# Patient Record
Sex: Female | Born: 1960 | Race: White | Hispanic: No | Marital: Married | Smoking: Never smoker
Health system: Southern US, Community
[De-identification: ages and names within clinical notes are randomized; demographics above are authoritative.]

## PROBLEM LIST (undated history)

## (undated) DIAGNOSIS — D0512 Intraductal carcinoma in situ of left breast: Secondary | ICD-10-CM

## (undated) DIAGNOSIS — J45909 Unspecified asthma, uncomplicated: Secondary | ICD-10-CM

## (undated) HISTORY — PX: WISDOM TOOTH EXTRACTION: SHX21

---

## 1997-06-23 ENCOUNTER — Other Ambulatory Visit: Admission: RE | Admit: 1997-06-23 | Discharge: 1997-06-23 | Payer: Self-pay | Admitting: Obstetrics & Gynecology

## 1998-09-20 ENCOUNTER — Other Ambulatory Visit: Admission: RE | Admit: 1998-09-20 | Discharge: 1998-09-20 | Payer: Self-pay | Admitting: Obstetrics & Gynecology

## 2000-03-29 ENCOUNTER — Other Ambulatory Visit: Admission: RE | Admit: 2000-03-29 | Discharge: 2000-03-29 | Payer: Self-pay | Admitting: Obstetrics & Gynecology

## 2001-06-10 ENCOUNTER — Other Ambulatory Visit: Admission: RE | Admit: 2001-06-10 | Discharge: 2001-06-10 | Payer: Self-pay | Admitting: Obstetrics & Gynecology

## 2002-07-09 ENCOUNTER — Other Ambulatory Visit: Admission: RE | Admit: 2002-07-09 | Discharge: 2002-07-09 | Payer: Self-pay | Admitting: Obstetrics & Gynecology

## 2004-03-02 ENCOUNTER — Other Ambulatory Visit: Admission: RE | Admit: 2004-03-02 | Discharge: 2004-03-02 | Payer: Self-pay | Admitting: Obstetrics & Gynecology

## 2014-02-05 ENCOUNTER — Other Ambulatory Visit: Payer: Self-pay | Admitting: Obstetrics & Gynecology

## 2014-02-05 DIAGNOSIS — R928 Other abnormal and inconclusive findings on diagnostic imaging of breast: Secondary | ICD-10-CM

## 2014-02-25 ENCOUNTER — Ambulatory Visit
Admission: RE | Admit: 2014-02-25 | Discharge: 2014-02-25 | Disposition: A | Discharge status: Home / Self Care | Payer: BLUE CROSS/BLUE SHIELD | Source: Ambulatory Visit | Attending: Obstetrics & Gynecology | Admitting: Obstetrics & Gynecology

## 2014-02-25 DIAGNOSIS — R928 Other abnormal and inconclusive findings on diagnostic imaging of breast: Secondary | ICD-10-CM

## 2019-10-08 LAB — COLOGUARD

## 2020-01-08 LAB — EXTERNAL GENERIC LAB PROCEDURE: COLOGUARD: NEGATIVE

## 2020-01-08 LAB — COLOGUARD: COLOGUARD: NEGATIVE

## 2021-02-11 ENCOUNTER — Other Ambulatory Visit: Payer: Self-pay | Admitting: Obstetrics & Gynecology

## 2021-02-11 DIAGNOSIS — R928 Other abnormal and inconclusive findings on diagnostic imaging of breast: Secondary | ICD-10-CM

## 2021-02-25 ENCOUNTER — Other Ambulatory Visit: Payer: Self-pay | Admitting: Obstetrics & Gynecology

## 2021-02-25 ENCOUNTER — Ambulatory Visit
Admission: RE | Admit: 2021-02-25 | Discharge: 2021-02-25 | Disposition: A | Discharge status: Home / Self Care | Payer: 59 | Source: Ambulatory Visit | Attending: Obstetrics & Gynecology | Admitting: Obstetrics & Gynecology

## 2021-02-25 ENCOUNTER — Ambulatory Visit
Admission: RE | Admit: 2021-02-25 | Discharge: 2021-02-25 | Disposition: A | Discharge status: Home / Self Care | Payer: BLUE CROSS/BLUE SHIELD | Source: Ambulatory Visit | Attending: Obstetrics & Gynecology | Admitting: Obstetrics & Gynecology

## 2021-02-25 DIAGNOSIS — R928 Other abnormal and inconclusive findings on diagnostic imaging of breast: Secondary | ICD-10-CM

## 2021-02-25 DIAGNOSIS — N6489 Other specified disorders of breast: Secondary | ICD-10-CM

## 2021-03-23 ENCOUNTER — Other Ambulatory Visit: Payer: BLUE CROSS/BLUE SHIELD

## 2021-04-08 ENCOUNTER — Ambulatory Visit: Payer: Self-pay | Admitting: Surgery

## 2021-04-08 DIAGNOSIS — C50912 Malignant neoplasm of unspecified site of left female breast: Secondary | ICD-10-CM

## 2021-04-08 NOTE — H&P (Signed)
?Subjective  ?  ?Chief Complaint: Breast Cancer ?  ?  ?  ?History of Present Illness: ?Gabriella Chaney is a 61 y.o. female who is seen today as an office consultation at the request of Dr. Stann Mainland for evaluation of Breast Cancer ?.   ?  ?This is a 61 year old female in good health who presents after recent routine screening mammogram revealed an area of asymmetry in the left upper outer quadrant.  She underwent further work-up including diagnostic mammogram and ultrasound.  There is no ultrasound correlate.  Ultrasound showed that her axilla was negative.  She underwent stereotactic biopsy at imaging center in [place].  She had a 1.2 cm mass that was biopsied under stereotactic guidance.  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  She presents now for her initial surgical evaluation.  She has not yet been referred to oncology or radiation oncology. ?  ?No family history of breast cancer in first-degree relatives.  No previous breast surgery.  No problems prior to her mammogram. ?  ?  ?Review of Systems: ?A complete review of systems was obtained from the patient.  I have reviewed this information and discussed as appropriate with the patient.  See HPI as well for other ROS. ?  ?Review of Systems  ?Constitutional: Negative.   ?HENT: Negative.   ?Eyes: Negative.   ?Respiratory: Negative.   ?Cardiovascular: Negative.   ?Gastrointestinal: Negative.   ?Genitourinary: Negative.   ?Musculoskeletal: Negative.   ?Skin: Negative.   ?Neurological: Negative.   ?Endo/Heme/Allergies: Negative.   ?Psychiatric/Behavioral: Negative.   ?  ?  ?  ?Medical History: ?Past Medical History  ?History reviewed. No pertinent past medical history.  ? ?  ?   ?Patient Active Problem List  ?Diagnosis  ? Invasive ductal carcinoma of breast, left (CMS-HCC)  ?  ?  ?Past Surgical History  ?History reviewed. No pertinent surgical history.  ?  ?  ?Allergies  ?No Known Allergies  ? ?  ?No current outpatient  medications on file prior to visit.  ?  ?No current facility-administered medications on file prior to visit.  ?  ?  ?Family History  ?     ?Family History  ?Problem Relation Age of Onset  ? Skin cancer Father    ? Obesity Father    ? High blood pressure (Hypertension) Father    ? Diabetes Father    ? Coronary Artery Disease (Blocked arteries around heart) Father    ?  ?  ?  ?Social History  ?  ?   ?Tobacco Use  ?Smoking Status Never  ?Smokeless Tobacco Never  ?  ?  ?Social History  ?Social History  ?  ?    ?Socioeconomic History  ? Marital status: Married  ?Tobacco Use  ? Smoking status: Never  ? Smokeless tobacco: Never  ?Vaping Use  ? Vaping Use: Never used  ?Substance and Sexual Activity  ? Alcohol use: Yes  ? Drug use: Never  ?  ?  ?  ?Objective:  ?  ?  ?   ?Vitals:  ?  04/08/21 1111  ?BP: 124/76  ?Pulse: 92  ?Temp: 36.1 ?C (97 ?F)  ?SpO2: 99%  ?Weight: 70.1 kg (154 lb 9.6 oz)  ?Height: 165.1 cm ($RemoveB'5\' 5"'GWmGRUzR$ )  ?  ?Body mass index is 25.73 kg/m?. ?  ?Physical Exam  ?  ?Constitutional:  WDWN in NAD, conversant, no obvious deformities; lying in bed comfortably ?Eyes:  Pupils equal, round; sclera anicteric;  moist conjunctiva; no lid lag ?HENT:  Oral mucosa moist; good dentition  ?Neck:  No masses palpated, trachea midline; no thyromegaly ?Lungs:  CTA bilaterally; normal respiratory effort ?Breasts:  symmetric, no nipple changes; no palpable masses or lymphadenopathy on the lateral right side; the left upper outer quadrant shows some bruising and firmness underneath the skin at the site of her biopsy.  Previous to the biopsy there was no palpable mass in this area per the patient.  No axillary lymphadenopathy is palpated. ?CV:  Regular rate and rhythm; no murmurs; extremities well-perfused with no edema ?Abd:  +bowel sounds, soft, non-tender, no palpable organomegaly; no palpable hernias ?Musc:  Unable to assess gait; no apparent clubbing or cyanosis in extremities ?Lymphatic:  No palpable cervical or axillary  lymphadenopathy ?Skin:  Warm, dry; no sign of jaundice ?Psychiatric - alert and oriented x 4; calm mood and affect ?  ?  ?Labs, Imaging and Diagnostic Testing: ?CLINICAL DATA:  Screening recall for a possible left breast ?asymmetry. ?  ?EXAM: ?DIGITAL DIAGNOSTIC UNILATERAL LEFT MAMMOGRAM WITH TOMOSYNTHESIS AND ?CAD; ULTRASOUND LEFT BREAST LIMITED ?  ?TECHNIQUE: ?Left digital diagnostic mammography and breast tomosynthesis was ?performed. The images were evaluated with computer-aided detection.; ?Targeted ultrasound examination of the left breast was performed. ?  ?COMPARISON:  Previous exam(s). ?  ?ACR Breast Density Category c: The breast tissue is heterogeneously ?dense, which may obscure small masses. ?  ?FINDINGS: ?Possible asymmetry, which was noted in the posterior, upper outer ?left breast, persists on the diagnostic images as a small irregular ?focal asymmetry or mass, measuring 9 mm in greatest dimension. This ?lies posteriorly in the upper outer left breast at approximately 2 ?o'clock. ?  ?Targeted ultrasound is performed, showing normal tissue throughout ?the 2 to 3 o'clock position of the left breast. No mass or ?suspicious lesion. No sonographic correlate to the mammographic ?finding. Left axilla ultrasound demonstrates normal lymph nodes. ?There are no enlarged or abnormal lymph nodes. ?  ?IMPRESSION: ?1. Irregular focal asymmetry in the posterior upper outer left ?breast. This appears new from prior mammograms. Tissue sampling is ?recommended. ?  ?RECOMMENDATION: ?1. Stereotactic core needle biopsy the small focal asymmetry the ?outer left breast. This procedure was scheduled prior to the patient ?being discharged from the breast Center. ?  ?I have discussed the findings and recommendations with the patient. ?If applicable, a reminder letter will be sent to the patient ?regarding the next appointment. ?  ?BI-RADS CATEGORY  4: Suspicious. ?  ?  ?Electronically Signed ?  By: Lajean Manes M.D. ?  On:  02/25/2021 16:38 ?  ?  ?Assessment and Plan:  ?Diagnoses and all orders for this visit: ?  ?Invasive ductal carcinoma of breast, left (CMS-HCC) ?-     Ambulatory Referral to Oncology-Medical ?-     Ambulatory Referral to Radiation Oncology ?  ?  ?  ?After a long discussion with the patient regarding her disease and treatment options, we discussed surgical options including mastectomy versus breast conserving therapy.  She has opted for breast conserving therapy.  We will plan a left radioactive seed localized lumpectomy with sentinel lymph node biopsy. ?  ?The surgical procedure has been discussed with the patient.  Potential risks, benefits, alternative treatments, and expected outcomes have been explained.  All of the patient's questions at this time have been answered.  The likelihood of reaching the patient's treatment goal is good.  The patient understand the proposed surgical procedure and wishes to proceed. ?  ?  ?No follow-ups  on file. ?  ?Blimy Napoleon Jearld Adjutant, MD  ?04/08/2021 ?11:54 AM ?  ?  ?High MDM ?  ?

## 2021-04-08 NOTE — H&P (View-Only) (Signed)
?Subjective  ?  ?Chief Complaint: Breast Cancer ?  ?  ?  ?History of Present Illness: ?Gabriella Chaney is a 61 y.o. female who is seen today as an office consultation at the request of Dr. Stann Mainland for evaluation of Breast Cancer ?.   ?  ?This is a 61 year old female in good health who presents after recent routine screening mammogram revealed an area of asymmetry in the left upper outer quadrant.  She underwent further work-up including diagnostic mammogram and ultrasound.  There is no ultrasound correlate.  Ultrasound showed that her axilla was negative.  She underwent stereotactic biopsy at imaging center in [place].  She had a 1.2 cm mass that was biopsied under stereotactic guidance.  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  She presents now for her initial surgical evaluation.  She has not yet been referred to oncology or radiation oncology. ?  ?No family history of breast cancer in first-degree relatives.  No previous breast surgery.  No problems prior to her mammogram. ?  ?  ?Review of Systems: ?A complete review of systems was obtained from the patient.  I have reviewed this information and discussed as appropriate with the patient.  See HPI as well for other ROS. ?  ?Review of Systems  ?Constitutional: Negative.   ?HENT: Negative.   ?Eyes: Negative.   ?Respiratory: Negative.   ?Cardiovascular: Negative.   ?Gastrointestinal: Negative.   ?Genitourinary: Negative.   ?Musculoskeletal: Negative.   ?Skin: Negative.   ?Neurological: Negative.   ?Endo/Heme/Allergies: Negative.   ?Psychiatric/Behavioral: Negative.   ?  ?  ?  ?Medical History: ?Past Medical History  ?History reviewed. No pertinent past medical history.  ? ?  ?   ?Patient Active Problem List  ?Diagnosis  ? Invasive ductal carcinoma of breast, left (CMS-HCC)  ?  ?  ?Past Surgical History  ?History reviewed. No pertinent surgical history.  ?  ?  ?Allergies  ?No Known Allergies  ? ?  ?No current outpatient  medications on file prior to visit.  ?  ?No current facility-administered medications on file prior to visit.  ?  ?  ?Family History  ?     ?Family History  ?Problem Relation Age of Onset  ? Skin cancer Father    ? Obesity Father    ? High blood pressure (Hypertension) Father    ? Diabetes Father    ? Coronary Artery Disease (Blocked arteries around heart) Father    ?  ?  ?  ?Social History  ?  ?   ?Tobacco Use  ?Smoking Status Never  ?Smokeless Tobacco Never  ?  ?  ?Social History  ?Social History  ?  ?    ?Socioeconomic History  ? Marital status: Married  ?Tobacco Use  ? Smoking status: Never  ? Smokeless tobacco: Never  ?Vaping Use  ? Vaping Use: Never used  ?Substance and Sexual Activity  ? Alcohol use: Yes  ? Drug use: Never  ?  ?  ?  ?Objective:  ?  ?  ?   ?Vitals:  ?  04/08/21 1111  ?BP: 124/76  ?Pulse: 92  ?Temp: 36.1 ?C (97 ?F)  ?SpO2: 99%  ?Weight: 70.1 kg (154 lb 9.6 oz)  ?Height: 165.1 cm ($RemoveB'5\' 5"'RkJoVgMC$ )  ?  ?Body mass index is 25.73 kg/m?. ?  ?Physical Exam  ?  ?Constitutional:  WDWN in NAD, conversant, no obvious deformities; lying in bed comfortably ?Eyes:  Pupils equal, round; sclera anicteric;  moist conjunctiva; no lid lag ?HENT:  Oral mucosa moist; good dentition  ?Neck:  No masses palpated, trachea midline; no thyromegaly ?Lungs:  CTA bilaterally; normal respiratory effort ?Breasts:  symmetric, no nipple changes; no palpable masses or lymphadenopathy on the lateral right side; the left upper outer quadrant shows some bruising and firmness underneath the skin at the site of her biopsy.  Previous to the biopsy there was no palpable mass in this area per the patient.  No axillary lymphadenopathy is palpated. ?CV:  Regular rate and rhythm; no murmurs; extremities well-perfused with no edema ?Abd:  +bowel sounds, soft, non-tender, no palpable organomegaly; no palpable hernias ?Musc:  Unable to assess gait; no apparent clubbing or cyanosis in extremities ?Lymphatic:  No palpable cervical or axillary  lymphadenopathy ?Skin:  Warm, dry; no sign of jaundice ?Psychiatric - alert and oriented x 4; calm mood and affect ?  ?  ?Labs, Imaging and Diagnostic Testing: ?CLINICAL DATA:  Screening recall for a possible left breast ?asymmetry. ?  ?EXAM: ?DIGITAL DIAGNOSTIC UNILATERAL LEFT MAMMOGRAM WITH TOMOSYNTHESIS AND ?CAD; ULTRASOUND LEFT BREAST LIMITED ?  ?TECHNIQUE: ?Left digital diagnostic mammography and breast tomosynthesis was ?performed. The images were evaluated with computer-aided detection.; ?Targeted ultrasound examination of the left breast was performed. ?  ?COMPARISON:  Previous exam(s). ?  ?ACR Breast Density Category c: The breast tissue is heterogeneously ?dense, which may obscure small masses. ?  ?FINDINGS: ?Possible asymmetry, which was noted in the posterior, upper outer ?left breast, persists on the diagnostic images as a small irregular ?focal asymmetry or mass, measuring 9 mm in greatest dimension. This ?lies posteriorly in the upper outer left breast at approximately 2 ?o'clock. ?  ?Targeted ultrasound is performed, showing normal tissue throughout ?the 2 to 3 o'clock position of the left breast. No mass or ?suspicious lesion. No sonographic correlate to the mammographic ?finding. Left axilla ultrasound demonstrates normal lymph nodes. ?There are no enlarged or abnormal lymph nodes. ?  ?IMPRESSION: ?1. Irregular focal asymmetry in the posterior upper outer left ?breast. This appears new from prior mammograms. Tissue sampling is ?recommended. ?  ?RECOMMENDATION: ?1. Stereotactic core needle biopsy the small focal asymmetry the ?outer left breast. This procedure was scheduled prior to the patient ?being discharged from the breast Center. ?  ?I have discussed the findings and recommendations with the patient. ?If applicable, a reminder letter will be sent to the patient ?regarding the next appointment. ?  ?BI-RADS CATEGORY  4: Suspicious. ?  ?  ?Electronically Signed ?  By: Lajean Manes M.D. ?  On:  02/25/2021 16:38 ?  ?  ?Assessment and Plan:  ?Diagnoses and all orders for this visit: ?  ?Invasive ductal carcinoma of breast, left (CMS-HCC) ?-     Ambulatory Referral to Oncology-Medical ?-     Ambulatory Referral to Radiation Oncology ?  ?  ?  ?After a long discussion with the patient regarding her disease and treatment options, we discussed surgical options including mastectomy versus breast conserving therapy.  She has opted for breast conserving therapy.  We will plan a left radioactive seed localized lumpectomy with sentinel lymph node biopsy. ?  ?The surgical procedure has been discussed with the patient.  Potential risks, benefits, alternative treatments, and expected outcomes have been explained.  All of the patient's questions at this time have been answered.  The likelihood of reaching the patient's treatment goal is good.  The patient understand the proposed surgical procedure and wishes to proceed. ?  ?  ?No follow-ups  on file. ?  ?Collyn Selk Jearld Adjutant, MD  ?04/08/2021 ?11:54 AM ?  ?  ?High MDM ?  ?

## 2021-04-13 ENCOUNTER — Telehealth: Payer: Self-pay | Admitting: Hematology and Oncology

## 2021-04-13 ENCOUNTER — Encounter (HOSPITAL_COMMUNITY): Payer: Self-pay

## 2021-04-13 ENCOUNTER — Other Ambulatory Visit: Payer: Self-pay | Admitting: Surgery

## 2021-04-13 ENCOUNTER — Telehealth: Payer: Self-pay | Admitting: Genetic Counselor

## 2021-04-13 ENCOUNTER — Other Ambulatory Visit: Payer: Self-pay | Admitting: *Deleted

## 2021-04-13 DIAGNOSIS — C50919 Malignant neoplasm of unspecified site of unspecified female breast: Secondary | ICD-10-CM | POA: Insufficient documentation

## 2021-04-13 DIAGNOSIS — C50412 Malignant neoplasm of upper-outer quadrant of left female breast: Secondary | ICD-10-CM | POA: Insufficient documentation

## 2021-04-13 DIAGNOSIS — C50912 Malignant neoplasm of unspecified site of left female breast: Secondary | ICD-10-CM

## 2021-04-13 NOTE — Telephone Encounter (Signed)
Scheduled appt per 3/8 referral. Pt is aware of appt date and time. Pt is aware to arrive 15 mins prior to appt time and to bring and updated insurance card. Pt is aware of appt location.   ?

## 2021-04-13 NOTE — Pre-Procedure Instructions (Signed)
Surgical Instructions ? ? ? Your procedure is scheduled on Wednesday, March 15th. ? Report to [facility] Main Entrance "A" at 06:30 A.M., then check in with the Admitting office. ? Call this number if you have problems the morning of surgery: ? [phone] ? ? If you have any questions prior to your surgery date call [phone]: Open Monday-Friday 8am-4pm ? ? ? Remember: ? Do not eat after midnight the night before your surgery ? ?You may drink clear liquids until 05:30 AM the morning of your surgery.   ?Clear liquids allowed are: Water, Non-Citrus Juices (without pulp), Carbonated Beverages, Clear Tea, Black Coffee Only (NO MILK, CREAM OR POWDERED CREAMER of any kind), and Gatorade. ? ? ?Patient Instructions ? ?The night before surgery:  ?No food after midnight. ONLY clear liquids after midnight ? ?The day of surgery (if you do NOT have diabetes):  ?Drink ONE (1) Pre-Surgery Clear Ensure by 05:30 AM the morning of surgery. Drink in one sitting. Do not sip.  ?This drink was given to you during your hospital  ?pre-op appointment visit. ? ?Nothing else to drink after completing the  ?Pre-Surgery Clear Ensure. ? ? ?       If you have questions, please contact your surgeon?s office.  ? ?  ? Take these medicines the morning of surgery with A SIP OF WATER: none ? ? ?As of today, STOP taking any Aspirin (unless otherwise instructed by your surgeon) Aleve, Naproxen, Ibuprofen, Motrin, Advil, Goody's, BC's, all herbal medications, fish oil, and all vitamins. ?         ?           ?Do NOT Smoke (Tobacco/Vaping) for 24 hours prior to your procedure. ? ?If you use a CPAP at night, you may bring your mask/headgear for your overnight stay. ?  ?Contacts, glasses, piercing's, hearing aid's, dentures or partials may not be worn into surgery, please bring cases for these belongings.  ?  ?For patients admitted to the hospital, discharge time will be determined by your treatment team. ?  ?Patients discharged the day of surgery will  not be allowed to drive home, and someone needs to stay with them for 24 hours. ? ?NO VISITORS WILL BE ALLOWED IN PRE-OP WHERE PATIENTS ARE PREPPED FOR SURGERY.  ONLY 1 SUPPORT PERSON MAY BE PRESENT IN THE WAITING ROOM WHILE YOU ARE IN SURGERY.  IF YOU ARE TO BE ADMITTED, ONCE YOU ARE IN YOUR ROOM YOU WILL BE ALLOWED TWO (2) VISITORS. (1) VISITOR MAY STAY OVERNIGHT BUT MUST ARRIVE TO THE ROOM BY 8pm.  Minor children may have two parents present. Special consideration for safety and communication needs will be reviewed on a case by case basis. ? ? ?Special instructions:   ?[facility]- Preparing For Surgery ? ?Before surgery, you can play an important role. Because skin is not sterile, your skin needs to be as free of germs as possible. You can reduce the number of germs on your skin by washing with CHG (chlorahexidine gluconate) Soap before surgery.  CHG is an antiseptic cleaner which kills germs and bonds with the skin to continue killing germs even after washing.   ? ?Oral Hygiene is also important to reduce your risk of infection.  Remember - BRUSH YOUR TEETH THE MORNING OF SURGERY WITH YOUR REGULAR TOOTHPASTE ? ?Please do not use if you have an allergy to CHG or antibacterial soaps. If your skin becomes reddened/irritated stop using the CHG.  ?Do not shave (including legs and  underarms) for at least 48 hours prior to first CHG shower. It is OK to shave your face. ? ?Please follow these instructions carefully. ?  ?Shower the NIGHT BEFORE SURGERY and the MORNING OF SURGERY ? ?If you chose to wash your hair, wash your hair first as usual with your normal shampoo. ? ?After you shampoo, rinse your hair and body thoroughly to remove the shampoo. ? ?Use CHG Soap as you would any other liquid soap. You can apply CHG directly to the skin and wash gently with a scrungie or a clean washcloth.  ? ?Apply the CHG Soap to your body ONLY FROM THE NECK DOWN.  Do not use on open wounds or open sores. Avoid contact with your eyes,  ears, mouth and genitals (private parts). Wash Face and genitals (private parts)  with your normal soap.  ? ?Wash thoroughly, paying special attention to the area where your surgery will be performed. ? ?Thoroughly rinse your body with warm water from the neck down. ? ?DO NOT shower/wash with your normal soap after using and rinsing off the CHG Soap. ? ?Pat yourself dry with a CLEAN TOWEL. ? ?Wear CLEAN PAJAMAS to bed the night before surgery ? ?Place CLEAN SHEETS on your bed the night before your surgery ? ?DO NOT SLEEP WITH PETS. ? ? ?Day of Surgery: ?Take a shower with CHG soap. ?Do not wear jewelry or makeup ?Do not wear lotions, powders, perfumes, or deodorant. ?Do not shave 48 hours prior to surgery.   ?Do not bring valuables to the hospital. [facility] is not responsible for any belongings or valuables. ?Do not wear nail polish, gel polish, artificial nails, or any other type of covering on natural nails (fingers and toes) ?If you have artificial nails or gel coating that need to be removed by a nail salon, please have this removed prior to surgery. Artificial nails or gel coating may interfere with anesthesia's ability to adequately monitor your vital signs. ? ?Wear Clean/Comfortable clothing the morning of surgery ?Remember to brush your teeth WITH YOUR REGULAR TOOTHPASTE. ?  ?Please read over the following fact sheets that you were given. ? ? ?3 days prior to your procedure  ? ?? You are not required to quarantine however you are required to wear a well-fitting mask when you are out and around people not in your household. If your mask becomes wet or soiled, replace with a new one. ? ?? Wash your hands often with soap and water for 20 seconds or clean your hands with an alcohol-based hand sanitizer that contains at least 60% alcohol. ? ?? Do not share personal items. ? ?? Notify your provider: ? ?o if you are in close contact with someone who has COVID ? ?o or if you develop a fever of 100.4 or greater,  sneezing, cough, sore throat, shortness of breath or body aches.   ?

## 2021-04-13 NOTE — Progress Notes (Signed)
PCP - Dr Vania Rea ?Cardiologist - n/a ? ?Chest x-ray - n/a ?EKG - n/a ?Stress Test - n/a ?ECHO - n/a ?Cardiac Cath - n/a ? ?ICD Pacemaker/Loop - n/a ? ?Sleep Study -  n/a ?CPAP - none ? ?ERAS: Clear liquids til 5:30 AM DOS. ? ?STOP now taking any Aspirin (unless otherwise instructed by your surgeon), Aleve, Naproxen, Ibuprofen, Motrin, Advil, Goody's, BC's, all herbal medications, fish oil, and all vitamins.  ? ?Coronavirus Screening ?Covid test n/a Ambulatory Surgery  ?Do you have any of the following symptoms:  ?Cough yes/no: No ?Fever (>100.66F)  yes/no: No ?Runny nose yes/no: No ?Sore throat yes/no: No ?Difficulty breathing/shortness of breath  yes/no: No ? ?Have you traveled in the last 14 days and where? yes/no: No ? ?Patient verbalized understanding of instructions that were given to them at the PAT appointment. Patient was also instructed that they will need to review over the PAT instructions again at home before surgery. ? ?

## 2021-04-14 ENCOUNTER — Other Ambulatory Visit: Payer: Self-pay

## 2021-04-14 ENCOUNTER — Encounter (HOSPITAL_COMMUNITY): Payer: Self-pay

## 2021-04-14 ENCOUNTER — Ambulatory Visit: Payer: PRIVATE HEALTH INSURANCE | Attending: Surgery

## 2021-04-14 ENCOUNTER — Encounter (HOSPITAL_COMMUNITY)
Admission: RE | Admit: 2021-04-14 | Discharge: 2021-04-14 | Disposition: A | Discharge status: Home / Self Care | Payer: PRIVATE HEALTH INSURANCE | Source: Ambulatory Visit | Attending: Surgery | Admitting: Surgery

## 2021-04-14 ENCOUNTER — Encounter: Payer: Self-pay | Admitting: *Deleted

## 2021-04-14 VITALS — BP 111/71 | HR 67 | Temp 98.1°F | Resp 17 | Ht 65.0 in | Wt 154.4 lb

## 2021-04-14 DIAGNOSIS — Z170 Estrogen receptor positive status [ER+]: Secondary | ICD-10-CM | POA: Insufficient documentation

## 2021-04-14 DIAGNOSIS — Z01812 Encounter for preprocedural laboratory examination: Secondary | ICD-10-CM | POA: Insufficient documentation

## 2021-04-14 DIAGNOSIS — C50912 Malignant neoplasm of unspecified site of left female breast: Secondary | ICD-10-CM | POA: Insufficient documentation

## 2021-04-14 DIAGNOSIS — R293 Abnormal posture: Secondary | ICD-10-CM | POA: Diagnosis present

## 2021-04-14 DIAGNOSIS — Z01818 Encounter for other preprocedural examination: Secondary | ICD-10-CM

## 2021-04-14 HISTORY — DX: Intraductal carcinoma in situ of left breast: D05.12

## 2021-04-14 HISTORY — DX: Unspecified asthma, uncomplicated: J45.909

## 2021-04-14 LAB — CBC
HCT: 41.5 % (ref 36.0–46.0)
Hemoglobin: 13.3 g/dL (ref 12.0–15.0)
MCH: 30.2 pg (ref 26.0–34.0)
MCHC: 32 g/dL (ref 30.0–36.0)
MCV: 94.3 fL (ref 80.0–100.0)
Platelets: 252 10*3/uL (ref 150–400)
RBC: 4.4 MIL/uL (ref 3.87–5.11)
RDW: 12.9 % (ref 11.5–15.5)
WBC: 4.6 10*3/uL (ref 4.0–10.5)
nRBC: 0 % (ref 0.0–0.2)

## 2021-04-14 NOTE — Therapy (Signed)
OUTPATIENT PHYSICAL THERAPY BREAST CANCER BASELINE EVALUATION   Patient Name: Gabriella Chaney MRN: [id number] DOB:05-23-1960, 61 y.o., female Today's Date: 04/14/2021   PT End of Session - 04/14/21 1502     Visit Number 1    Number of Visits 2    Date for PT Re-Evaluation 05/26/21    PT Start Time 1303    PT Stop Time 1349    PT Time Calculation (min) 46 min    Activity Tolerance Patient tolerated treatment well    Behavior During Therapy WFL for tasks assessed/performed             Past Medical History:  Diagnosis Date   Asthma    as a child, no current problems as an adult, no inhaler   Ductal carcinoma in situ (DCIS) of left breast    Past Surgical History:  Procedure Laterality Date   WISDOM TOOTH EXTRACTION     Patient Active Problem List   Diagnosis Date Noted   Breast cancer in female ([place]) 04/13/2021    PCP: Pcp, No  REFERRING PROVIDER: Donnie Mesa, MD  REFERRING DIAG: Left Breast Cancer  THERAPY DIAG:  Malignant neoplasm of left breast in female, estrogen receptor positive, unspecified site of breast ([place])  Abnormal posture  ONSET DATE: 02/25/2021  SUBJECTIVE                                                                                                                                                                                           SUBJECTIVE STATEMENT: Patient reports she is here today to be seen by her medical team for her newly diagnosed left breast cancer.   PERTINENT HISTORY:  Patient was diagnosed on 03/23/2021 with left grade 2 Invasive Ductal Carcinoma. It measures 1.2 cm and is located in the upper outer quadrant. It is ER+, PR+,HER 2-    PATIENT GOALS   reduce lymphedema risk and learn post op HEP.   PAIN:  Are you having pain? No   PRECAUTIONS: Active CA  HAND DOMINANCE: left  WEIGHT BEARING RESTRICTIONS No  FALLS:  Has patient fallen in last 6 months? No, Number of falls: 0  LIVING ENVIRONMENT: Patient lives with:  husband Lives in: House/apartment  OCCUPATION: [employer] loans works in Technical sales engineer, Full time  LEISURE: none  PRIOR LEVEL OF FUNCTION: Independent   OBJECTIVE  COGNITION:  Overall cognitive status: Within functional limits for tasks assessed    POSTURE:  Forward head and rounded shoulders posture  UPPER EXTREMITY AROM/PROM:  A/PROM RIGHT  04/14/2021   Shoulder extension 60  Shoulder flexion 158  Shoulder abduction 180  Shoulder internal rotation 68  Shoulder external rotation 105    (Blank rows = not tested)  A/PROM LEFT  04/14/2021  Shoulder extension 65  Shoulder flexion 159  Shoulder abduction 180  Shoulder internal rotation 65  Shoulder external rotation 107    (Blank rows = not tested)   CERVICAL AROM: All within normal limits:      UPPER EXTREMITY STRENGTH: WNL   LYMPHEDEMA ASSESSMENTS:   LANDMARK RIGHT  04/14/2021  10 cm proximal to olecranon process 28.2  Olecranon process 24.7  10 cm proximal to ulnar styloid process 20.5  Just proximal to ulnar styloid process 14.8  Across hand at thumb web space 18.0  At base of 2nd digit 5.7  (Blank rows = not tested)  LANDMARK LEFT  04/14/2021  10 cm proximal to olecranon process 28.7  Olecranon process 24.8  10 cm proximal to ulnar styloid process 21.3  Just proximal to ulnar styloid process 15.0  Across hand at thumb web space 19.1  At base of 2nd digit 5.9  (Blank rows = not tested)   L-DEX LYMPHEDEMA SCREENING:   The patient was assessed using the L-Dex machine today to produce a lymphedema index baseline score. The patient will be reassessed on a regular basis (typically every 3 months) to obtain new L-Dex scores. If the score is > 6.5 points away from his/her baseline score indicating onset of subclinical lymphedema, it will be recommended to wear a compression garment for 4 weeks, 12 hours per day and then be reassessed. If the score continues to be > 6.5 points from baseline at reassessment, we will  initiate lymphedema treatment. Assessing in this manner has a 95% rate of preventing clinically significant lymphedema.   L-DEX FLOWSHEETS - 04/14/21 1300       L-DEX LYMPHEDEMA SCREENING   Measurement Type Unilateral    L-DEX MEASUREMENT EXTREMITY Upper Extremity    POSITION  Standing    DOMINANT SIDE Left    At Risk Side Left    BASELINE SCORE (UNILATERAL) -2.9              QUICK DASH SURVEY:4.55   PATIENT EDUCATION:  Education details: Lymphedema risk reduction and post op shoulder/posture HEP Person educated: Patient Education method: Explanation, Demonstration, Handout Education comprehension: Patient verbalized understanding and returned demonstration   HOME EXERCISE PROGRAM: Patient was instructed today in a home exercise program today for post op shoulder range of motion. These included active assist shoulder flexion in sitting, scapular retraction, wall walking with shoulder abduction, and hands behind head external rotation.  She was encouraged to do these twice a day, holding 3 seconds and repeating 5 times when permitted by her physician.   ASSESSMENT:  CLINICAL IMPRESSION: Patients multidisciplinary medical team met prior to her assessments to determine a recommended treatment plan. She is planning to have a left lumpectomy with SLNB. She will benefit from a post op PT reassessment to determine needs and from L-Dex screens every 3 months for 2 years to detect subclinical lymphedema.  Pt will benefit from skilled therapeutic intervention to improve on the following deficits: Decreased knowledge of precautions, impaired UE functional use, pain, decreased ROM, postural dysfunction.   PT treatment/interventions: ADL/self-care home management, pt/family education, therapeutic exercise  REHAB POTENTIAL: Excellent  CLINICAL DECISION MAKING: Stable/uncomplicated  EVALUATION COMPLEXITY: Low   GOALS: Goals reviewed with patient? YES  LONG TERM GOALS:  (STG=LTG)   Name Target Date Goal status  1 Pt will be able to verbalize understanding of pertinent lymphedema risk reduction practices  relevant to her dx specifically related to skin care.  Baseline:  No knowledge 04/14/2021 Achieved at eval  2 Pt will be able to return demo and/or verbalize understanding of the post op HEP related to regaining shoulder ROM. Baseline:  No knowledge 04/14/2021 Achieved at eval  3 Pt will be able to verbalize understanding of the importance of attending the post op After Breast CA Class for further lymphedema risk reduction education and therapeutic exercise.  Baseline:  No knowledge 04/14/2021 Achieved at eval  4 Pt will demo she has regained full shoulder ROM and function post operatively compared to baselines.  Baseline: See objective measurements taken today. 05/26/2021 Initial evaluation     PLAN: PT FREQUENCY/DURATION: EVAL and 1 follow up appointment.   PLAN FOR NEXT SESSION: will reassess 3-4 weeks post op to determine needs.   Patient will follow up at outpatient cancer rehab 3-4 weeks following surgery.  If the patient requires physical therapy at that time, a specific plan will be dictated and sent to the referring physician for approval. The patient was educated today on appropriate basic range of motion exercises to begin post operatively and the importance of attending the After Breast Cancer class following surgery.  Patient was educated today on lymphedema risk reduction practices as it pertains to recommendations that will benefit the patient immediately following surgery.  She verbalized good understanding.    Physical Therapy Information for After Breast Cancer Surgery/Treatment:  Lymphedema is a swelling condition that you may be at risk for in your arm if you have lymph nodes removed from the armpit area.  After a sentinel node biopsy, the risk is approximately 5-9% and is higher after an axillary node dissection.  There is treatment available  for this condition and it is not life-threatening.  Contact your physician or physical therapist with concerns. You may begin the 4 shoulder/posture exercises (see additional sheet) when permitted by your physician (typically a week after surgery).  If you have drains, you may need to wait until those are removed before beginning range of motion exercises.  A general recommendation is to not lift your arms above shoulder height until drains are removed.  These exercises should be done to your tolerance and gently.  This is not a "no pain/no gain" type of recovery so listen to your body and stretch into the range of motion that you can tolerate, stopping if you have pain.  If you are having immediate reconstruction, ask your plastic surgeon about doing exercises as he or she may want you to wait. We encourage you to attend the free one time ABC (After Breast Cancer) class offered by [facility].  You will learn information related to lymphedema risk, prevention and treatment and additional exercises to regain mobility following surgery.  You can call [phone] for more information.  This is offered the 1st and 3rd Monday of each month.  You only attend the class one time. While undergoing any medical procedure or treatment, try to avoid blood pressure being taken or needle sticks from occurring on the arm on the side of cancer.   This recommendation begins after surgery and continues for the rest of your life.  This may help reduce your risk of getting lymphedema (swelling in your arm). An excellent resource for those seeking information on lymphedema is the National Lymphedema Network's web site. It can be accessed at [place].org If you notice swelling in your hand, arm or breast at any time following  surgery (even if it is many years from now), please contact your doctor or physical therapist to discuss this.  Lymphedema can be treated at any time but it is easier for you if it  is treated early on.  If you feel like your shoulder motion is not returning to normal in a reasonable amount of time, please contact your surgeon or physical therapist.  [facility] [phone]. [address], [place] [address]  ABC CLASS After Breast Cancer Class  After Breast Cancer Class is a specially designed exercise class to assist you in a safe recover after having breast cancer surgery.  In this class you will learn how to get back to full function whether your drains were just removed or if you had surgery a month ago.  This one-time class is held the 1st and 3rd Monday of every month from 11:00 a.m. until 12:00 noon virtually.  This class is FREE and space is limited. For more information or to register for the next available class, call [phone].  Class Goals  Understand specific stretches to improve the flexibility of you chest and shoulder. Learn ways to safely strengthen your upper body and improve your posture. Understand the warning signs of infection and why you may be at risk for an arm infection. Learn about Lymphedema and prevention.  ** You do not attend this class until after surgery.  Drains must be removed to participate  Patient was instructed today in a home exercise program today for post op shoulder range of motion. These included active assist shoulder flexion in sitting, scapular retraction, wall walking with shoulder abduction, and hands behind head external rotation.  She was encouraged to do these twice a day, holding 3 seconds and repeating 5 times when permitted by her physician.    Claris Pong, PT 04/14/2021, 3:03 PM

## 2021-04-18 NOTE — Progress Notes (Incomplete)
New Breast Cancer Diagnosis:Left Breast Cancer  Did patient present with symptoms (if so, please note symptoms) or screening mammography?: Ultrasound showed a possible asymmetry in the upper outer quadrant of the left breast persisting on mammogram measuring 9 mm.    Location and Extent of disease :left breast. Located at 2 o'clock position, measured 9 mm in greatest dimension. Adenopathy no.   Histology per Pathology Report: grade 2, Invasive Ductal Carcinoma with associated intermediate grade DCIS. 03/23/2021  Receptor Status: ER(positive), PR (positive), Her2-neu (negative), Ki-(%)   Surgeon and surgical plan, if any:  Dr. Georgette Dover  Left Breast Lumpectomy with radioactive seed and SLN biopsy 04/20/2021   Medical oncologist, treatment if any:   Dr. Lindi Adie 04/28/2021   Family History of Breast/Ovarian/Prostate Cancer:   Lymphedema issues, if any:      Pain issues, if any:     SAFETY ISSUES: Prior radiation?  Pacemaker/ICD?  Possible current pregnancy? Postmenopausal Is the patient on methotrexate?   Current Complaints / other details:   Genetics 05/12/2021

## 2021-04-18 NOTE — Progress Notes (Incomplete)
Radiation Oncology         [phone] ________________________________  Name: Gabriella Chaney        MRN: [id number]  Date of Service: 04/19/2021 DOB: 1960-05-01  CC:Pcp, No  Donnie Mesa, MD     REFERRING PHYSICIAN: Donnie Mesa, MD   DIAGNOSIS: The encounter diagnosis was Malignant neoplasm of left breast in female, estrogen receptor positive, unspecified site of breast ([place]).   HISTORY OF PRESENT ILLNESS: Gabriella Chaney is a 61 y.o. female seen at the request of Dr. Georgette Dover for new diagnosis of left breast cancer.She had a screening mammogram that showed a possible mass in the left breast asymmetry.  Further diagnostic work-up including ultrasound showed a possible asymmetry in the upper outer quadrant of the left breast persisting on mammogram measuring 9 mm in greatest dimension in the 2 o'clock position.  By ultrasound no correlate could be identified and her left axilla was negative for adenopathy.  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  The cancer was ER/PR positive, HER2 was negative Ki-67 was not reported she is scheduled to undergo a left breast lumpectomy with sentinel lymph node biopsy on 04/20/2021 and is seen to discuss adjuvant radiotherapy.  PREVIOUS RADIATION THERAPY: No   PAST MEDICAL HISTORY:  Past Medical History:  Diagnosis Date   Asthma    as a child, no current problems as an adult, no inhaler   Ductal carcinoma in situ (DCIS) of left breast        PAST SURGICAL HISTORY: Past Surgical History:  Procedure Laterality Date   WISDOM TOOTH EXTRACTION       FAMILY HISTORY: No family history on file.   SOCIAL HISTORY:  reports that she has never smoked. She has never used smokeless tobacco. She reports that she does not drink alcohol and does not use drugs.  The patient is married and lives in [place].  She***   ALLERGIES: Patient has no known  allergies.   MEDICATIONS:  Current Outpatient Medications  Medication Sig Dispense Refill   Multiple Vitamins-Minerals (MULTIVITAMIN WITH MINERALS) tablet Take 1 tablet by mouth daily.     OVER THE COUNTER MEDICATION Take 1 tablet by mouth daily. vitamin b17     No current facility-administered medications for this visit.     REVIEW OF SYSTEMS: On review of systems, the patient reports that she is doing ***     PHYSICAL EXAM:  Wt Readings from Last 3 Encounters:  04/14/21 154 lb 6.4 oz (70 kg)   Temp Readings from Last 3 Encounters:  04/14/21 98.1 F (36.7 C) (Oral)   BP Readings from Last 3 Encounters:  04/14/21 111/71   Pulse Readings from Last 3 Encounters:  04/14/21 67    In general this is a well appearing *** female in no acute distress. She's alert and oriented x4 and appropriate throughout the examination. Cardiopulmonary assessment is negative for acute distress and she exhibits normal effort. Bilateral breast exam is deferred.    ECOG = ***  0 - Asymptomatic (Fully active, able to carry on all predisease activities without restriction)  1 - Symptomatic but completely ambulatory (Restricted in physically strenuous activity but ambulatory and able to carry out work of a light or sedentary nature. For example, light housework, office work)  2 - Symptomatic, <50% in bed during the day (Ambulatory and capable of all self care but unable to carry out any work activities.  Up and about more than 50% of waking hours)  3 - Symptomatic, >50% in bed, but not bedbound (Capable of only limited self-care, confined to bed or chair 50% or more of waking hours)  4 - Bedbound (Completely disabled. Cannot carry on any self-care. Totally confined to bed or chair)  5 - Death   Eustace Pen MM, Creech RH, Tormey DC, et al. [phone]). "Toxicity and response criteria of the [facility]". [place]. 5 (6): 649-55    LABORATORY DATA:  Lab Results  Component  Value Date   WBC 4.6 04/14/2021   HGB 13.3 04/14/2021   HCT 41.5 04/14/2021   MCV 94.3 04/14/2021   PLT 252 04/14/2021   No results found for: NA, K, CL, CO2 No results found for: ALT, AST, GGT, ALKPHOS, BILITOT    RADIOGRAPHY: No results found.     IMPRESSION/PLAN: 1. Stage IA, [id number], grade 2, ER/PR positive invasive ductal carcinoma of the left breast.Dr. Lisbeth Renshaw discusses the pathology findings and reviews the nature of *** breast disease. The consensus from the breast conference includes breast conservation with lumpectomy with *** sentinel node biopsy. Depending on the size of the final tumor measurements rendered by pathology, the tumor may be tested for Oncotype Dx score to determine a role for systemic therapy. Provided that chemotherapy is not indicated, the patient's course would then be followed by external radiotherapy to the breast  to reduce risks of local recurrence followed by antiestrogen therapy. We discussed the risks, benefits, short, and long term effects of radiotherapy, as well as the curative intent, and the patient is interested in proceeding. Dr. Lisbeth Renshaw discusses the delivery and logistics of radiotherapy and anticipates a course of *** weeks of radiotherapy. We will see her back a few weeks after surgery to discuss the simulation process and anticipate we starting radiotherapy about 4-6 weeks after surgery.    In a visit lasting *** minutes, greater than 50% of the time was spent face to face reviewing her case, as well as in preparation of, discussing, and coordinating the patient's care.  The above documentation reflects my direct findings during this shared patient visit. Please see the separate note by Dr. Lisbeth Renshaw on this date for the remainder of the patient's plan of care.    Carola Rhine, [facility]    **Disclaimer: This note was dictated with voice recognition software. Similar sounding words can inadvertently be transcribed and this note may contain  transcription errors which may not have been corrected upon publication of note.**

## 2021-04-19 ENCOUNTER — Other Ambulatory Visit: Payer: Self-pay

## 2021-04-19 ENCOUNTER — Encounter: Payer: Self-pay | Admitting: Radiation Oncology

## 2021-04-19 ENCOUNTER — Ambulatory Visit
Admission: RE | Admit: 2021-04-19 | Discharge: 2021-04-19 | Disposition: A | Discharge status: Home / Self Care | Payer: PRIVATE HEALTH INSURANCE | Source: Ambulatory Visit | Attending: Radiation Oncology | Admitting: Radiation Oncology

## 2021-04-19 ENCOUNTER — Ambulatory Visit
Admission: RE | Admit: 2021-04-19 | Discharge: 2021-04-19 | Disposition: A | Discharge status: Home / Self Care | Payer: PRIVATE HEALTH INSURANCE | Source: Ambulatory Visit | Attending: Surgery | Admitting: Surgery

## 2021-04-19 VITALS — BP 115/76 | HR 62 | Temp 97.7°F | Resp 20 | Ht 65.0 in | Wt 154.0 lb

## 2021-04-19 DIAGNOSIS — C50912 Malignant neoplasm of unspecified site of left female breast: Secondary | ICD-10-CM

## 2021-04-19 DIAGNOSIS — C50412 Malignant neoplasm of upper-outer quadrant of left female breast: Secondary | ICD-10-CM | POA: Diagnosis present

## 2021-04-19 DIAGNOSIS — Z170 Estrogen receptor positive status [ER+]: Secondary | ICD-10-CM | POA: Diagnosis not present

## 2021-04-19 NOTE — Anesthesia Preprocedure Evaluation (Addendum)
Anesthesia Evaluation  ?Patient identified by MRN, date of birth, ID band ?Patient awake ? ? ? ?Reviewed: ?Allergy & Precautions, NPO status , Patient's Chart, lab work & pertinent test results ? ?Airway ?Mallampati: II ? ?TM Distance: >3 FB ?Neck ROM: Full ? ? ? Dental ? ?(+) Teeth Intact, Dental Advisory Given ?  ?Pulmonary ?asthma ,  ?  ?Pulmonary exam normal ?breath sounds clear to auscultation ? ? ? ? ? ? Cardiovascular ?negative cardio ROS ?Normal cardiovascular exam ?Rhythm:Regular Rate:Normal ? ? ?  ?Neuro/Psych ?negative neurological ROS ?   ? GI/Hepatic ?negative GI ROS, Neg liver ROS,   ?Endo/Other  ?negative endocrine ROS ? Renal/GU ?negative Renal ROS  ? ?  ?Musculoskeletal ?negative musculoskeletal ROS ?(+)  ? Abdominal ?  ?Peds ? Hematology ?negative hematology ROS ?(+)   ?Anesthesia Other Findings ?LEFT BREAST INVASIVE DUCTAL CARCINOMA ? Reproductive/Obstetrics ? ?  ? ? ? ? ? ? ? ? ? ? ? ? ? ?  ?  ? ? ? ? ? ? ? ?Anesthesia Physical ?Anesthesia Plan ? ?ASA: 2 ? ?Anesthesia Plan: General  ? ?Post-op Pain Management: Regional block* and Tylenol PO (pre-op)*  ? ?Induction: Intravenous ? ?PONV Risk Score and Plan: 3 and Midazolam, Dexamethasone and Ondansetron ? ?Airway Management Planned: LMA ? ?Additional Equipment:  ? ?Intra-op Plan:  ? ?Post-operative Plan: Extubation in OR ? ?Informed Consent: I have reviewed the patients History and Physical, chart, labs and discussed the procedure including the risks, benefits and alternatives for the proposed anesthesia with the patient or authorized representative who has indicated his/her understanding and acceptance.  ? ? ? ?Dental advisory given ? ?Plan Discussed with: CRNA ? ?Anesthesia Plan Comments:   ? ? ? ? ? ?Anesthesia Quick Evaluation ? ?

## 2021-04-20 ENCOUNTER — Encounter (HOSPITAL_COMMUNITY): Payer: Self-pay | Admitting: Surgery

## 2021-04-20 ENCOUNTER — Ambulatory Visit (HOSPITAL_COMMUNITY)
Admission: RE | Admit: 2021-04-20 | Discharge: 2021-04-20 | Disposition: A | Discharge status: Home / Self Care | Payer: PRIVATE HEALTH INSURANCE | Source: Ambulatory Visit | Attending: Surgery | Admitting: Surgery

## 2021-04-20 ENCOUNTER — Other Ambulatory Visit: Payer: Self-pay

## 2021-04-20 ENCOUNTER — Ambulatory Visit (HOSPITAL_COMMUNITY): Payer: PRIVATE HEALTH INSURANCE | Admitting: Anesthesiology

## 2021-04-20 ENCOUNTER — Ambulatory Visit (HOSPITAL_BASED_OUTPATIENT_CLINIC_OR_DEPARTMENT_OTHER): Payer: PRIVATE HEALTH INSURANCE | Admitting: Anesthesiology

## 2021-04-20 ENCOUNTER — Encounter (HOSPITAL_COMMUNITY)
Admission: RE | Disposition: A | Discharge status: Home / Self Care | Payer: Self-pay | Source: Ambulatory Visit | Attending: Surgery

## 2021-04-20 ENCOUNTER — Ambulatory Visit
Admission: RE | Admit: 2021-04-20 | Discharge: 2021-04-20 | Disposition: A | Discharge status: Home / Self Care | Payer: PRIVATE HEALTH INSURANCE | Source: Ambulatory Visit | Attending: Surgery | Admitting: Surgery

## 2021-04-20 DIAGNOSIS — C50412 Malignant neoplasm of upper-outer quadrant of left female breast: Secondary | ICD-10-CM | POA: Insufficient documentation

## 2021-04-20 DIAGNOSIS — Z170 Estrogen receptor positive status [ER+]: Secondary | ICD-10-CM | POA: Diagnosis not present

## 2021-04-20 DIAGNOSIS — J45909 Unspecified asthma, uncomplicated: Secondary | ICD-10-CM | POA: Diagnosis not present

## 2021-04-20 DIAGNOSIS — C50912 Malignant neoplasm of unspecified site of left female breast: Secondary | ICD-10-CM

## 2021-04-20 SURGERY — BREAST LUMPECTOMY WITH RADIOACTIVE SEED AND SENTINEL LYMPH NODE BIOPSY
Anesthesia: General | Site: Breast | Laterality: Left

## 2021-04-20 MED ORDER — LACTATED RINGERS IV SOLN: INTRAVENOUS | Status: DC

## 2021-04-20 MED ORDER — HYDROCODONE-ACETAMINOPHEN 5-325 MG PO TABS: 1.0000 | ORAL_TABLET | Freq: Four times a day (QID) | ORAL | 0 refills | Status: DC | PRN

## 2021-04-20 MED ORDER — MIDAZOLAM HCL 2 MG/2ML IJ SOLN
INTRAMUSCULAR | Status: AC
  Filled 2021-04-20: qty 2

## 2021-04-20 MED ORDER — ORAL CARE MOUTH RINSE: 15.0000 mL | Freq: Once | OROMUCOSAL | Status: AC

## 2021-04-20 MED ORDER — EPHEDRINE SULFATE-NACL 50-0.9 MG/10ML-% IV SOSY
PREFILLED_SYRINGE | INTRAVENOUS | Status: DC | PRN
Start: 2021-04-20 — End: 2021-04-20
  Administered 2021-04-20: 5 mg via INTRAVENOUS

## 2021-04-20 MED ORDER — FENTANYL CITRATE (PF) 250 MCG/5ML IJ SOLN
INTRAMUSCULAR | Status: AC
  Filled 2021-04-20: qty 5

## 2021-04-20 MED ORDER — CHLORHEXIDINE GLUCONATE 0.12 % MT SOLN
15.0000 mL | Freq: Once | OROMUCOSAL | Status: AC
  Administered 2021-04-20: 15 mL via OROMUCOSAL
  Filled 2021-04-20: qty 15

## 2021-04-20 MED ORDER — MIDAZOLAM HCL 2 MG/2ML IJ SOLN
INTRAMUSCULAR | Status: DC | PRN
  Administered 2021-04-20 (×2): 1 mg via INTRAVENOUS

## 2021-04-20 MED ORDER — BUPIVACAINE-EPINEPHRINE 0.25% -1:200000 IJ SOLN
INTRAMUSCULAR | Status: DC | PRN
  Administered 2021-04-20: 10 mL

## 2021-04-20 MED ORDER — DEXAMETHASONE SODIUM PHOSPHATE 10 MG/ML IJ SOLN
INTRAMUSCULAR | Status: DC | PRN
  Administered 2021-04-20: 10 mg

## 2021-04-20 MED ORDER — ACETAMINOPHEN 500 MG PO TABS
1000.0000 mg | ORAL_TABLET | ORAL | Status: AC
  Administered 2021-04-20: 1000 mg via ORAL
  Filled 2021-04-20: qty 2

## 2021-04-20 MED ORDER — BUPIVACAINE-EPINEPHRINE (PF) 0.5% -1:200000 IJ SOLN
INTRAMUSCULAR | Status: DC | PRN
  Administered 2021-04-20: 30 mL via PERINEURAL

## 2021-04-20 MED ORDER — 0.9 % SODIUM CHLORIDE (POUR BTL) OPTIME
TOPICAL | Status: DC | PRN
  Administered 2021-04-20: 1000 mL

## 2021-04-20 MED ORDER — CEFAZOLIN SODIUM-DEXTROSE 2-4 GM/100ML-% IV SOLN
2.0000 g | INTRAVENOUS | Status: AC
  Administered 2021-04-20: 2 g via INTRAVENOUS
  Filled 2021-04-20: qty 100

## 2021-04-20 MED ORDER — CHLORHEXIDINE GLUCONATE CLOTH 2 % EX PADS
6.0000 | MEDICATED_PAD | Freq: Once | CUTANEOUS | Status: DC
Start: 2021-04-20 — End: 2021-04-20

## 2021-04-20 MED ORDER — TECHNETIUM TC 99M TILMANOCEPT KIT
1.0000 | PACK | Freq: Once | INTRAVENOUS | Status: AC | PRN
  Administered 2021-04-20: 1 via INTRADERMAL

## 2021-04-20 MED ORDER — LIDOCAINE 2% (20 MG/ML) 5 ML SYRINGE
INTRAMUSCULAR | Status: DC | PRN
Start: 2021-04-20 — End: 2021-04-20
  Administered 2021-04-20: 60 mg via INTRAVENOUS

## 2021-04-20 MED ORDER — PROPOFOL 10 MG/ML IV BOLUS
INTRAVENOUS | Status: DC | PRN
  Administered 2021-04-20: 140 mg via INTRAVENOUS

## 2021-04-20 MED ORDER — PHENYLEPHRINE 40 MCG/ML (10ML) SYRINGE FOR IV PUSH (FOR BLOOD PRESSURE SUPPORT)
PREFILLED_SYRINGE | INTRAVENOUS | Status: DC | PRN
  Administered 2021-04-20 (×5): 60 ug via INTRAVENOUS

## 2021-04-20 MED ORDER — ONDANSETRON HCL 4 MG/2ML IJ SOLN
INTRAMUSCULAR | Status: DC | PRN
  Administered 2021-04-20: 4 mg via INTRAVENOUS

## 2021-04-20 MED ORDER — CHLORHEXIDINE GLUCONATE CLOTH 2 % EX PADS: 6.0000 | MEDICATED_PAD | Freq: Once | CUTANEOUS | Status: DC

## 2021-04-20 MED ORDER — PROPOFOL 10 MG/ML IV BOLUS
INTRAVENOUS | Status: AC
  Filled 2021-04-20: qty 20

## 2021-04-20 MED ORDER — ONDANSETRON HCL 4 MG/2ML IJ SOLN: 4.0000 mg | Freq: Once | INTRAMUSCULAR | Status: DC

## 2021-04-20 MED ORDER — FENTANYL CITRATE (PF) 250 MCG/5ML IJ SOLN
INTRAMUSCULAR | Status: DC | PRN
  Administered 2021-04-20 (×2): 50 ug via INTRAVENOUS

## 2021-04-20 MED ORDER — BUPIVACAINE-EPINEPHRINE (PF) 0.25% -1:200000 IJ SOLN
INTRAMUSCULAR | Status: AC
  Filled 2021-04-20: qty 30

## 2021-04-20 MED ORDER — FENTANYL CITRATE (PF) 100 MCG/2ML IJ SOLN: 25.0000 ug | INTRAMUSCULAR | Status: DC | PRN

## 2021-04-20 SURGICAL SUPPLY — 50 items
APL PRP STRL LF DISP 70% ISPRP (MISCELLANEOUS) ×1
APL SKNCLS STERI-STRIP NONHPOA (GAUZE/BANDAGES/DRESSINGS) ×1
APPLIER CLIP 9.375 MED OPEN (MISCELLANEOUS) ×2
APR CLP MED 9.3 20 MLT OPN (MISCELLANEOUS) ×1
BAG COUNTER SPONGE SURGICOUNT (BAG) ×3 IMPLANT
BAG SPNG CNTER NS LX DISP (BAG) ×1
BENZOIN TINCTURE PRP APPL 2/3 (GAUZE/BANDAGES/DRESSINGS) ×3 IMPLANT
BINDER BREAST LRG (GAUZE/BANDAGES/DRESSINGS) IMPLANT
BINDER BREAST XLRG (GAUZE/BANDAGES/DRESSINGS) ×1 IMPLANT
CANISTER SUCT 3000ML PPV (MISCELLANEOUS) ×3 IMPLANT
CHLORAPREP W/TINT 26 (MISCELLANEOUS) ×3 IMPLANT
CLIP APPLIE 9.375 MED OPEN (MISCELLANEOUS) ×2 IMPLANT
CNTNR URN SCR LID CUP LEK RST (MISCELLANEOUS) ×2 IMPLANT
CONT SPEC 4OZ STRL OR WHT (MISCELLANEOUS) ×2
COVER PROBE W GEL 5X96 (DRAPES) ×3 IMPLANT
COVER SURGICAL LIGHT HANDLE (MISCELLANEOUS) ×3 IMPLANT
DEVICE DUBIN SPECIMEN MAMMOGRA (MISCELLANEOUS) ×3 IMPLANT
DRAPE CHEST BREAST 15X10 FENES (DRAPES) ×3 IMPLANT
DRSG TEGADERM 2-3/8X2-3/4 SM (GAUZE/BANDAGES/DRESSINGS) ×2 IMPLANT
DRSG TEGADERM 4X4.75 (GAUZE/BANDAGES/DRESSINGS) ×6 IMPLANT
ELECT CAUTERY BLADE 6.4 (BLADE) ×3 IMPLANT
ELECT REM PT RETURN 9FT ADLT (ELECTROSURGICAL) ×2
ELECTRODE REM PT RTRN 9FT ADLT (ELECTROSURGICAL) ×2 IMPLANT
GAUZE SPONGE 2X2 8PLY STRL LF (GAUZE/BANDAGES/DRESSINGS) ×2 IMPLANT
GLOVE SURG ENC MOIS LTX SZ7 (GLOVE) ×3 IMPLANT
GLOVE SURG UNDER POLY LF SZ7.5 (GLOVE) ×3 IMPLANT
GOWN STRL REUS W/ TWL LRG LVL3 (GOWN DISPOSABLE) ×4 IMPLANT
GOWN STRL REUS W/TWL LRG LVL3 (GOWN DISPOSABLE) ×4
KIT BASIN OR (CUSTOM PROCEDURE TRAY) ×3 IMPLANT
KIT MARKER MARGIN INK (KITS) IMPLANT
LIGHT WAVEGUIDE WIDE FLAT (MISCELLANEOUS) IMPLANT
NDL 18GX1X1/2 (RX/OR ONLY) (NEEDLE) ×2 IMPLANT
NDL FILTER BLUNT 18X1 1/2 (NEEDLE) ×2 IMPLANT
NDL HYPO 25GX1X1/2 BEV (NEEDLE) ×4 IMPLANT
NEEDLE 18GX1X1/2 (RX/OR ONLY) (NEEDLE) ×2 IMPLANT
NEEDLE FILTER BLUNT 18X 1/2SAF (NEEDLE) ×1
NEEDLE FILTER BLUNT 18X1 1/2 (NEEDLE) ×1 IMPLANT
NEEDLE HYPO 25GX1X1/2 BEV (NEEDLE) ×4 IMPLANT
NS IRRIG 1000ML POUR BTL (IV SOLUTION) ×3 IMPLANT
PACK GENERAL/GYN (CUSTOM PROCEDURE TRAY) ×3 IMPLANT
SPONGE GAUZE 2X2 8PLY STRL LF (GAUZE/BANDAGES/DRESSINGS) ×1 IMPLANT
SPONGE GAUZE 2X2 STER 10/PKG (GAUZE/BANDAGES/DRESSINGS) ×1
SPONGE T-LAP 4X18 ~~LOC~~+RFID (SPONGE) ×3 IMPLANT
STRIP CLOSURE SKIN 1/2X4 (GAUZE/BANDAGES/DRESSINGS) ×3 IMPLANT
SUT MNCRL AB 4-0 PS2 18 (SUTURE) ×6 IMPLANT
SUT VIC AB 3-0 SH 27 (SUTURE) ×4
SUT VIC AB 3-0 SH 27X BRD (SUTURE) ×4 IMPLANT
SYR CONTROL 10ML LL (SYRINGE) ×6 IMPLANT
TOWEL GREEN STERILE (TOWEL DISPOSABLE) ×3 IMPLANT
TOWEL GREEN STERILE FF (TOWEL DISPOSABLE) ×3 IMPLANT

## 2021-04-20 NOTE — Interval H&P Note (Signed)
History and Physical Interval Note: ? ?04/20/2021 ?7:51 AM ? ?[name]  has presented today for surgery, with the diagnosis of LEFT BREAST INVASIVE DUCTAL CARCINOMA.  The various methods of treatment have been discussed with the patient and family. After consideration of risks, benefits and other options for treatment, the patient has consented to  Procedure(s): ?LEFT BREAST LUMPECTOMY WITH RADIOACTIVE SEED AND AXILLARY SENTINEL LYMPH NODE BIOPSY (Left) as a surgical intervention.  The patient's history has been reviewed, patient examined, no change in status, stable for surgery.  I have reviewed the patient's chart and labs.  Questions were answered to the patient's satisfaction.   ? ? ?Gabriella Chaney ? ? ?

## 2021-04-20 NOTE — Anesthesia Postprocedure Evaluation (Signed)
Anesthesia Post Note ? ?Patient: Gabriella Chaney ? ?Procedure(s) Performed: LEFT BREAST LUMPECTOMY WITH RADIOACTIVE SEED AND AXILLARY SENTINEL LYMPH NODE BIOPSY (Left: Breast) ? ?  ? ?Patient location during evaluation: PACU ?Anesthesia Type: General ?Level of consciousness: awake and alert ?Pain management: pain level controlled ?Vital Signs Assessment: post-procedure vital signs reviewed and stable ?Respiratory status: spontaneous breathing, nonlabored ventilation, respiratory function stable and patient connected to nasal cannula oxygen ?Cardiovascular status: blood pressure returned to baseline and stable ?Postop Assessment: no apparent nausea or vomiting ?Anesthetic complications: no ? ? ?No notable events documented. ? ?Last Vitals:  ?Vitals:  ? 04/20/21 0955 04/20/21 1010  ?BP: 113/82 120/72  ?Pulse: 72 63  ?Resp: 20 20  ?Temp:  (!) 36.2 ?C  ?SpO2: 100% 100%  ?  ?Last Pain:  ?Vitals:  ? 04/20/21 1010  ?TempSrc:   ?PainSc: 0-No pain  ? ? ?  ?  ?  ?  ?  ?  ? ?[place] ? ? ? ? ?

## 2021-04-20 NOTE — Op Note (Signed)
Pre-op Diagnosis:  Invasive ductal carcinoma left breast ?Post-op Diagnosis: same ?Procedure:  Left radioactive seed localized lumpectomy and sentinel lymph node biopsy Surgeon:  Adel Neyer K. ?Anesthesia:  GEN - LMA/ PEC block ?Indications:  This is a 61 year old female in good health who presents after recent routine screening mammogram revealed an area of asymmetry in the left upper outer quadrant.  She underwent further work-up including diagnostic mammogram and ultrasound.  There is no ultrasound correlate.  Ultrasound showed that her axilla was negative.  She underwent stereotactic biopsy at imaging center in [place].  She had a 1.2 cm mass that was biopsied under stereotactic guidance.  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  She presents now for excision and sentinel lymph node biopsy.  Radioactive seed was placed yesterday and she received a periareolar injection of technetium sulfur colloid in pre-op. ? ?Description of procedure: The patient is brought to the operating room placed in supine position on the operating room table. After an adequate level of general anesthesia was obtained, her left breast was prepped with ChloraPrep and draped in sterile fashion. A timeout was taken to ensure the proper patient and proper procedure. We interrogated the breast with the neoprobe.  The radioactive seed is in the upper outer left breast near the axilla.  We made an incision in the lateral breast/ low axilla after infiltrating with 0.25% Marcaine. Dissection was carried down in the breast tissue with cautery. We used the neoprobe to guide [place] towards the radioactive seed. We excised an area of tissue around the radioactive seed 3 cm in diameter. The specimen was removed and was oriented with a paint kit. Specimen mammogram showed the radioactive seed as well as the biopsy clip within the specimen. This was sent for pathologic examination. There is no residual  radioactivity within the biopsy cavity. Clips were placed in all five margins.  We inspected carefully for hemostasis. The wound was thoroughly irrigated.  ? ?We then turned our attention to the axilla.  The settings were adjusted on the Neoprobe and we interrogated the axilla. I was able to identify an area of activity in the lower axilla through the original incision  We dissected into the axilla and identified a radioactive lymph node.   This was dissected free and sent as "sentinel lymph node #1".  We interrogated the axilla again and there was minimal background activity. ? ?We irrigated the wound thoroughly and inspected for hemostasis.   ?The wounds were closed with a deep layer of 3-0 Vicryl and a subcuticular layer of 4-0 Monocryl. Benzoin Steri-Strips were applied. The patient was then extubated and brought to the recovery room in stable condition. All sponge, instrument, and needle counts are correct. ? ?Imogene Burn. Levette Paulick, MD, FACS ?Between Surgery  ?General/ Trauma Surgery ? ?12/29/2019 ?1:38 PM ? ?

## 2021-04-20 NOTE — Anesthesia Procedure Notes (Signed)
Procedure Name: LMA Insertion ?Date/Time: 04/20/2021 8:33 AM ?Performed by: Mariea Clonts, CRNA ?Pre-anesthesia Checklist: Patient identified, Emergency Drugs available, Suction available and Patient being monitored ?Patient Re-evaluated:Patient Re-evaluated prior to induction ?Oxygen Delivery Method: Circle System Utilized ?Preoxygenation: Pre-oxygenation with 100% oxygen ?Induction Type: IV induction ?Ventilation: Mask ventilation without difficulty ?LMA: LMA inserted ?LMA Size: 4.0 ?Number of attempts: 1 ?Airway Equipment and Method: Bite block ?Placement Confirmation: positive ETCO2 ?Tube secured with: Tape ?Dental Injury: Teeth and Oropharynx as per pre-operative assessment  ? ? ? ? ?

## 2021-04-20 NOTE — Anesthesia Procedure Notes (Signed)
Anesthesia Regional Block: Pectoralis block  ? ?Pre-Anesthetic Checklist: , timeout performed,  Correct Patient, Correct Site, Correct Laterality,  Correct Procedure, Correct Position, site marked,  Risks and benefits discussed,  Surgical consent,  Pre-op evaluation,  At surgeon's request and post-op pain management ? ?Laterality: Left ? ?Prep: chloraprep     ?  ?Needles:  ?Injection technique: Single-shot ? ?Needle Type: Echogenic Needle   ? ? ?Needle Length: 9cm  ?Needle Gauge: 21  ? ? ? ?Additional Needles: ? ? ?Procedures:,,,, ultrasound used (permanent image in chart),,    ?Narrative:  ?Start time: 04/20/2021 8:00 AM ?End time: 04/20/2021 8:06 AM ?Injection made incrementally with aspirations every 5 mL. ? ?Performed by: Personally  ?Anesthesiologist: Santa Lighter, MD ? ?Additional Notes: ?No pain on injection. No increased resistance to injection. Injection made in 5cc increments.  Good needle visualization.  Patient tolerated procedure well. ? ? ? ? ?

## 2021-04-20 NOTE — Transfer of Care (Signed)
Immediate Anesthesia Transfer of Care Note ? ?Patient: Gabriella Chaney ? ?Procedure(s) Performed: LEFT BREAST LUMPECTOMY WITH RADIOACTIVE SEED AND AXILLARY SENTINEL LYMPH NODE BIOPSY (Left: Breast) ? ?Patient Location: PACU ? ?Anesthesia Type:GA combined with regional for post-op pain ? ?Level of Consciousness: awake, alert  and oriented ? ?Airway & Oxygen Therapy: Patient Spontanous Breathing and Patient connected to nasal cannula oxygen ? ?Post-op Assessment: Report given to RN and Post -op Vital signs reviewed and stable ? ?Post vital signs: Reviewed and stable ? ?Last Vitals:  ?Vitals Value Taken Time  ?BP 121/72 04/20/21 0939  ?Temp    ?Pulse 74 04/20/21 0941  ?Resp 14 04/20/21 0941  ?SpO2 100 % 04/20/21 0941  ?Vitals shown include unvalidated device data. ? ?Last Pain:  ?Vitals:  ? 04/20/21 0649  ?TempSrc:   ?PainSc: 0-No pain  ?   ? ?  ? ?Complications: No notable events documented. ?

## 2021-04-20 NOTE — Discharge Instructions (Signed)
[facility] Office Phone Number [phone]  BREAST BIOPSY/ PARTIAL MASTECTOMY: POST OP INSTRUCTIONS  Always review your discharge instruction sheet given to you by the facility where your surgery was performed.  IF YOU HAVE DISABILITY OR FAMILY LEAVE FORMS, YOU MUST BRING THEM TO THE OFFICE FOR PROCESSING.  DO NOT GIVE THEM TO YOUR DOCTOR.  A prescription for pain medication may be given to you upon discharge.  Take your pain medication as prescribed, if needed.  If narcotic pain medicine is not needed, then you may take acetaminophen (Tylenol) or ibuprofen (Advil) as needed. Take your usually prescribed medications unless otherwise directed If you need a refill on your pain medication, please contact your pharmacy.  They will contact our office to request authorization.  Prescriptions will not be filled after 5pm or on week-ends. You should eat very light the first 24 hours after surgery, such as soup, crackers, pudding, etc.  Resume your normal diet the day after surgery. Most patients will experience some swelling and bruising in the breast.  Ice packs and a good support bra will help.  Swelling and bruising can take several days to resolve.  It is common to experience some constipation if taking pain medication after surgery.  Increasing fluid intake and taking a stool softener will usually help or prevent this problem from occurring.  A mild laxative (Milk of Magnesia or Miralax) should be taken according to package directions if there are no bowel movements after 48 hours. Unless discharge instructions indicate otherwise, you may remove your bandages 24-48 hours after surgery, and you may shower at that time.  You may have steri-strips (small skin tapes) in place directly over the incision.  These strips should be left on the skin for 7-10 days.  If your surgeon used skin glue on the incision, you may shower in 24 hours.  The glue will flake off over the next 2-3 weeks.  Any  sutures or staples will be removed at the office during your follow-up visit. ACTIVITIES:  You may resume regular daily activities (gradually increasing) beginning the next day.  Wearing a good support bra or sports bra minimizes pain and swelling.  You may have sexual intercourse when it is comfortable. You may drive when you no longer are taking prescription pain medication, you can comfortably wear a seatbelt, and you can safely maneuver your car and apply brakes. RETURN TO WORK:  ______________________________________________________________________________________ You should see your doctor in the office for a follow-up appointment approximately two weeks after your surgery.  Your doctor's nurse will typically make your follow-up appointment when she calls you with your pathology report.  Expect your pathology report 2-3 business days after your surgery.  You may call to check if you do not hear from us after three days. OTHER INSTRUCTIONS: _______________________________________________________________________________________________ _____________________________________________________________________________________________________________________________________ _____________________________________________________________________________________________________________________________________ _____________________________________________________________________________________________________________________________________  WHEN TO CALL YOUR DOCTOR: Fever over 101.0 Nausea and/or vomiting. Extreme swelling or bruising. Continued bleeding from incision. Increased pain, redness, or drainage from the incision.  The clinic staff is available to answer your questions during regular business hours.  Please don't hesitate to call and ask to speak to one of the nurses for clinical concerns.  If you have a medical emergency, go to the nearest emergency room or call 911.  A surgeon from [facility] is always on call at the hospital.  For further questions, please visit [email or web address]   

## 2021-04-21 ENCOUNTER — Encounter (HOSPITAL_COMMUNITY): Payer: Self-pay | Admitting: Surgery

## 2021-04-22 ENCOUNTER — Encounter: Payer: Self-pay | Admitting: Licensed Clinical Social Worker

## 2021-04-22 NOTE — Progress Notes (Signed)
[place] Clinical Social Work  ?Initial Assessment ? ? ?ORVETTA DANIELSKI is a 61 y.o. year old female contacted by phone. Clinical Social Work was referred by  radiation oncology  for assessment of psychosocial needs.  ? ?SDOH (Social Determinants of Health) assessments performed: Yes ?SDOH Interventions   ? ?Flowsheet Row Most Recent Value  ?SDOH Interventions   ?Financial Strain Interventions Other (Comment), Financial Counselor  [cancer foundations]  ?Transportation Interventions Other (Comment)  [[employer]]  ? ?  ?  ?Distress Screen completed: No ?No flowsheet data found. ? ? ? ?Family/Social Information:  ?Housing Arrangement: patient lives with spouse ?Family members/support persons in your life? Family ?Transportation concerns: yes, cost when coming for daily radiation  ?Employment: Working full time as Patent examiner (commission based only). Income source: Employment ?Financial concerns: Yes, current concerns ?Type of concern: Rent/ mortgage and Medical bills ?Food access concerns: no ?Religious or spiritual practice: not addressed ?Services Currently in place:  n/a ? ?Coping/ Adjustment to diagnosis: ?Patient understands treatment plan and what happens next? Yes. Had surgery 2 days ago and recovering well. Will see Dr. Lindi Adie next week and will have radiation once healed ?Concerns about diagnosis and/or treatment: How I will pay for the services I need ?Patient reported stressors: Finances ?Current coping skills/ strengths: Communication skills  and Motivation for treatment/growth  ? ? ? SUMMARY: ?Current SDOH Barriers:  ?Financial constraints related to limited income due to decreased hours ? ?Clinical Social Work Clinical Goal(s):  ?Patient will complete applications for assistance ? ?Interventions: ?Discussed common feeling and emotions when being diagnosed with cancer, and the importance of support during treatment ?Informed patient of the support team roles and support services at [facility] ?Provided CSW  contact information and encouraged patient to call with any questions or concerns ?Mailed patient application for [employer]. Will sign up for [employer] when pt begins radiation therapy ?Completed and submitted [employer] application today ? ? ?Follow Up Plan: Patient will work on [employer] and notify CSW of any questions or for assistance submitting ?Patient verbalizes understanding of plan: Yes ? ? ? ?Aishia Barkey E Chemeka Filice, LCSW ?

## 2021-04-25 ENCOUNTER — Telehealth: Payer: Self-pay | Admitting: Radiation Oncology

## 2021-04-25 ENCOUNTER — Encounter: Payer: Self-pay | Admitting: *Deleted

## 2021-04-25 DIAGNOSIS — C50912 Malignant neoplasm of unspecified site of left female breast: Secondary | ICD-10-CM

## 2021-04-25 NOTE — Telephone Encounter (Signed)
LVM to schedule FUN ?

## 2021-04-26 ENCOUNTER — Telehealth: Payer: Self-pay | Admitting: Radiation Oncology

## 2021-04-26 LAB — SURGICAL PATHOLOGY

## 2021-04-26 NOTE — Telephone Encounter (Signed)
LVM to schedule FUN ?

## 2021-04-27 NOTE — Progress Notes (Signed)
[place] ?CONSULT NOTE ? ?Patient Care Team: ?Pcp, No as PCP - General ?Mauro Kaufmann, RN as Oncology Nurse Navigator ?Rockwell Germany, RN as Oncology Nurse Navigator ? ?CHIEF COMPLAINTS/PURPOSE OF CONSULTATION:  ?Newly diagnosed breast cancer Left breast Malignant neoplasm  ? ?HISTORY OF PRESENTING ILLNESS:  ?Gabriella Chaney 61 y.o. female is here because of recent diagnosis of left Breast Malignant neoplasm. Screening mammogram detected left breast asymmetry. Ultrasound did not  reveal any abnormality.Biopsy revealed grade 1 invasive ductal cancer.  Estrogen receptor positive, Progesterone receptor  positive and negative Her 2. She presents to the clinic today for a consult. ?She is healing recovering very well from recent surgery. ? ?I reviewed her records extensively and collaborated the history with the patient. ? ?SUMMARY OF ONCOLOGIC HISTORY: ?Oncology History  ?Breast cancer in female [facility])  ?04/20/2021 Initial Diagnosis  ? Left lumpectomy: Grade 1 IDC 0.5 cm, margins negative, 0/3 lymph nodes negative ER 100%, PR 100%, Ki-67 2%, HER2 0 negative ?  ? ? ? ?MEDICAL HISTORY:  ?Past Medical History:  ?Diagnosis Date  ? Asthma   ? as a child, no current problems as an adult, no inhaler  ? Ductal carcinoma in situ (DCIS) of left breast   ? ? ?SURGICAL HISTORY: ?Past Surgical History:  ?Procedure Laterality Date  ? BREAST LUMPECTOMY WITH RADIOACTIVE SEED AND SENTINEL LYMPH NODE BIOPSY Left 04/20/2021  ? Procedure: LEFT BREAST LUMPECTOMY WITH RADIOACTIVE SEED AND AXILLARY SENTINEL LYMPH NODE BIOPSY;  Surgeon: Donnie Mesa, MD;  Location: [place];  Service: General;  Laterality: Left;  ? WISDOM TOOTH EXTRACTION    ? ? ?SOCIAL HISTORY: ?Social History  ? ?Socioeconomic History  ? Marital status: Married  ?  Spouse name: Not on file  ? Number of children: Not on file  ? Years of education: Not on file  ? Highest education level: Not on file  ?Occupational History  ? Not on file  ?Tobacco Use  ? Smoking  status: Never  ? Smokeless tobacco: Never  ? Tobacco comments:  ?  Patient's husband is a smoker.  ?Vaping Use  ? Vaping Use: Never used  ?Substance and Sexual Activity  ? Alcohol use: Never  ? Drug use: Never  ? Sexual activity: Not Currently  ?  Birth control/protection: Post-menopausal  ?Other Topics Concern  ? Not on file  ?Social History Narrative  ? Not on file  ? ?Social Determinants of Health  ? ?Financial Resource Strain: High Risk  ? Difficulty of Paying Living Expenses: Hard  ?Food Insecurity: Not on file  ?Transportation Needs: No Transportation Needs  ? Lack of Transportation (Medical): No  ? Lack of Transportation (Non-Medical): No  ?Physical Activity: Not on file  ?Stress: Not on file  ?Social Connections: Not on file  ?Intimate Partner Violence: Not At Risk  ? Fear of Current or Ex-Partner: No  ? Emotionally Abused: No  ? Physically Abused: No  ? Sexually Abused: No  ? ? ?FAMILY HISTORY: ?Family History  ?Problem Relation Age of Onset  ? Prostate cancer Father   ? ? ?ALLERGIES:  has No Known Allergies. ? ?MEDICATIONS:  ?Current Outpatient Medications  ?Medication Sig Dispense Refill  ? HYDROcodone-acetaminophen (NORCO/VICODIN) 5-325 MG tablet Take 1 tablet by mouth every 6 (six) hours as needed for moderate pain. 20 tablet 0  ? Multiple Vitamins-Minerals (MULTIVITAMIN WITH MINERALS) tablet Take 1 tablet by mouth daily.    ? OVER THE COUNTER MEDICATION Take 1 tablet by mouth daily. vitamin  b17    ? ?No current facility-administered medications for this visit.  ? ? ?REVIEW OF SYSTEMS:   ?All other systems were reviewed with the patient and are negative. ? ?PHYSICAL EXAMINATION: ?ECOG PERFORMANCE STATUS: 1 - Symptomatic but completely ambulatory ? ?Vitals:  ? 04/28/21 1611  ?BP: 120/64  ?Pulse: 73  ?Resp: 18  ?Temp: 97.8 ?F (36.6 ?C)  ?SpO2: 99%  ? ?Filed Weights  ? 04/28/21 1611  ?Weight: 153 lb 11.2 oz (69.7 kg)  ? ?  ? ?LABORATORY DATA:  ?I have reviewed the data as listed ?Lab Results  ?Component  Value Date  ? WBC 4.6 04/14/2021  ? HGB 13.3 04/14/2021  ? HCT 41.5 04/14/2021  ? MCV 94.3 04/14/2021  ? PLT 252 04/14/2021  ? ?No results found for: NA, K, CL, CO2 ? ?RADIOGRAPHIC STUDIES: ?I have personally reviewed the radiological reports and agreed with the findings in the report. ? ?ASSESSMENT AND PLAN:  ?Breast cancer in female [facility]) ?04/20/2021:Left lumpectomy: Grade 1 IDC 0.5 cm, margins negative, 0/3 lymph nodes negative ER 100%, PR 100%, Ki-67 2%, HER2 0 negative ? ?Pathology counseling: I discussed the final pathology report of the patient provided  a copy of this report. I discussed the margins as well as lymph node surgeries. We also discussed the final staging along with previously performed ER/PR and HER-2/neu testing. ? ?Treatment plan: ?1.  Adjuvant radiation ?2. followed by adjuvant antiestrogen therapy ? ?Letrozole counseling: We discussed the risks and benefits of anti-estrogen therapy with aromatase inhibitors. These include but not limited to insomnia, hot flashes, mood changes, vaginal dryness, bone density loss, and weight gain. We strongly believe that the benefits far outweigh the risks. Patient understands these risks and consented to starting treatment. Planned treatment duration is 5 years. ? ?Return to clinic at the end of radiation ? ? ?All questions were answered. The patient knows to call the clinic with any problems, questions or concerns. ?  ? Harriette Ohara, MD ?04/28/21 ? I Gardiner Coins am scribing for Dr. Lindi Adie ? ?I have reviewed the above documentation for accuracy and completeness, and I agree with the above. ?  ?

## 2021-04-28 ENCOUNTER — Inpatient Hospital Stay: Payer: PRIVATE HEALTH INSURANCE | Attending: Hematology and Oncology | Admitting: Hematology and Oncology

## 2021-04-28 ENCOUNTER — Other Ambulatory Visit: Payer: Self-pay

## 2021-04-28 DIAGNOSIS — Z170 Estrogen receptor positive status [ER+]: Secondary | ICD-10-CM | POA: Insufficient documentation

## 2021-04-28 DIAGNOSIS — J45909 Unspecified asthma, uncomplicated: Secondary | ICD-10-CM | POA: Diagnosis not present

## 2021-04-28 DIAGNOSIS — C50912 Malignant neoplasm of unspecified site of left female breast: Secondary | ICD-10-CM | POA: Diagnosis present

## 2021-04-28 NOTE — Assessment & Plan Note (Addendum)
04/20/2021:Left lumpectomy: Grade 1 IDC 0.5 cm, margins negative, 0/3 lymph nodes negative ER 100%, PR 100%, Ki-67 2%, HER2 0 negative ? ?Pathology counseling: I discussed the final pathology report of the patient provided  a copy of this report. I discussed the margins as well as lymph node surgeries. We also discussed the final staging along with previously performed ER/PR and HER-2/neu testing. ? ?Treatment plan: ?1.  Adjuvant radiation ?2. followed by adjuvant antiestrogen therapy ? ?Letrozole counseling: We discussed the risks and benefits of anti-estrogen therapy with aromatase inhibitors. These include but not limited to insomnia, hot flashes, mood changes, vaginal dryness, bone density loss, and weight gain. We strongly believe that the benefits far outweigh the risks. Patient understands these risks and consented to starting treatment. Planned treatment duration is 5 years. ? ?Return to clinic at the end of radiation ?

## 2021-04-29 ENCOUNTER — Telehealth: Payer: Self-pay | Admitting: *Deleted

## 2021-04-29 NOTE — Telephone Encounter (Signed)
Received call from pt requesting to speak with MD regarding the need for radiation tx and chance of recurrence without radiation tx. Pt states she has done some research on radiation tx and may not want to proceed due to the possible side effects.  RN will forward request to MD and request pt be called.  ?

## 2021-05-03 ENCOUNTER — Encounter: Payer: Self-pay | Admitting: *Deleted

## 2021-05-03 ENCOUNTER — Other Ambulatory Visit: Payer: Self-pay

## 2021-05-03 DIAGNOSIS — C50919 Malignant neoplasm of unspecified site of unspecified female breast: Secondary | ICD-10-CM

## 2021-05-09 NOTE — Progress Notes (Signed)
?Radiation Oncology         (336) [phone] ?________________________________ ? ?Name: Gabriella Chaney        MRN: [id number]  ?Date of Service: 05/12/2021 DOB: 11-08-1960 ? ?CC:Pcp, No  Nicholas Lose, MD    ? ?REFERRING PHYSICIAN: Nicholas Lose, MD ? ? ?DIAGNOSIS: The encounter diagnosis was Malignant neoplasm of left breast in female, estrogen receptor positive, unspecified site of breast ([place]). ? ? ?HISTORY OF PRESENT ILLNESS: Gabriella Chaney is a 61 y.o. female with a diagnosis of left breast cancer. She had a screening mammogram that showed a possible mass in the left breast asymmetry.  Further diagnostic work-up including ultrasound showed a possible asymmetry in the upper outer quadrant of the left breast persisting on mammogram measuring 9 mm in greatest dimension in the 2 o'clock position.  By ultrasound no correlate could be identified and her left axilla was negative for adenopathy.  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  The cancer was ER/PR positive, HER2 was negative Ki-67 was not reported. ? ?She underwent left breast lumpectomy with sentinel lymph node biopsy on 04/20/2021 which revealed a grade 1 invasive ductal carcinoma measuring 5 mm.  Her margins were negative, and 3 sentinel lymph nodes were all negative for disease.  She is seen today to discuss adjuvant radiotherapy. ? ?PREVIOUS RADIATION THERAPY: No ? ? ?PAST MEDICAL HISTORY:  ?Past Medical History:  ?Diagnosis Date  ? Asthma   ? as a child, no current problems as an adult, no inhaler  ? Ductal carcinoma in situ (DCIS) of left breast   ?   ? ? ?PAST SURGICAL HISTORY: ?Past Surgical History:  ?Procedure Laterality Date  ? BREAST LUMPECTOMY WITH RADIOACTIVE SEED AND SENTINEL LYMPH NODE BIOPSY Left 04/20/2021  ? Procedure: LEFT BREAST LUMPECTOMY WITH RADIOACTIVE SEED AND AXILLARY SENTINEL LYMPH NODE BIOPSY;  Surgeon: Donnie Mesa, MD;  Location: [facility];  Service:  General;  Laterality: Left;  ? WISDOM TOOTH EXTRACTION    ? ? ? ?FAMILY HISTORY:  ?Family History  ?Problem Relation Age of Onset  ? Prostate cancer Father   ? ? ? ?SOCIAL HISTORY:  reports that she has never smoked. She has never used smokeless tobacco. She reports that she does not drink alcohol and does not use drugs.  The patient is married and lives in [place].  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 ? ? ?ALLERGIES: Patient has no known allergies. ? ? ?MEDICATIONS:  ?Current Outpatient Medications  ?Medication Sig Dispense Refill  ? HYDROcodone-acetaminophen (NORCO/VICODIN) 5-325 MG tablet Take 1 tablet by mouth every 6 (six) hours as needed for moderate pain. 20 tablet 0  ? Multiple Vitamins-Minerals (MULTIVITAMIN WITH MINERALS) tablet Take 1 tablet by mouth daily.    ? OVER THE COUNTER MEDICATION Take 1 tablet by mouth daily. vitamin b17    ? ?No current facility-administered medications for this encounter.  ? ? ? ?REVIEW OF SYSTEMS: On review of systems, the patient reports that she is doing well since surgery with some soreness and numbness in her axilla but no other complaints. ? ?  ? ?PHYSICAL EXAM:  ?Wt Readings from Last 3 Encounters:  ?04/28/21 153 lb 11.2 oz (69.7 kg)  ?04/20/21 154 lb (69.9 kg)  ?04/19/21 154 lb (69.9 kg)  ? ?Temp Readings  from Last 3 Encounters:  ?04/28/21 97.8 ?F (36.6 ?C) (Temporal)  ?04/20/21 (!) 97.2 ?F (36.2 ?C)  ?04/19/21 97.7 ?F (36.5 ?C)  ? ?BP Readings from Last 3 Encounters:  ?04/28/21 120/64  ?04/20/21 120/72  ?04/19/21 115/76  ? ?Pulse Readings from Last 3 Encounters:  ?04/28/21 73  ?04/20/21 63  ?04/19/21 62  ? ? ?In general this is a well appearing caucasian female in no acute distress. She's alert and oriented x4 and appropriate throughout the examination. Cardiopulmonary assessment is negative for acute distress and she  exhibits normal effort. Her left breast incision is well healed. No erythema, separation or drainage is noted of the incision. ? ? ? ?ECOG = 1 ? ?0 - Asymptomatic (Fully active, able to carry on all predisease activities without restriction) ? ?1 - Symptomatic but completely ambulatory (Restricted in physically strenuous activity but ambulatory and able to carry out work of a light or sedentary nature. For example, light housework, office work) ? ?2 - Symptomatic, <50% in bed during the day (Ambulatory and capable of all self care but unable to carry out any work activities. Up and about more than 50% of waking hours) ? ?3 - Symptomatic, >50% in bed, but not bedbound (Capable of only limited self-care, confined to bed or chair 50% or more of waking hours) ? ?4 - Bedbound (Completely disabled. Cannot carry on any self-care. Totally confined to bed or chair) ? ?5 - Death ? ? Oken MM, Creech RH, Tormey DC, et al. [phone]). "Toxicity and response criteria of the [facility]". [place] Oncol. 5 (6): 649-55 ? ? ? ?LABORATORY DATA:  ?Lab Results  ?Component Value Date  ? WBC 4.6 04/14/2021  ? HGB 13.3 04/14/2021  ? HCT 41.5 04/14/2021  ? MCV 94.3 04/14/2021  ? PLT 252 04/14/2021  ? ?No results found for: NA, K, CL, CO2 ?No results found for: ALT, AST, GGT, ALKPHOS, BILITOT ?  ? ?RADIOGRAPHY: NM Sentinel Node Inj-No Rpt (Breast) ? ?Result Date: 04/20/2021 ?Sulfur Colloid was injected by the Nuclear Medicine Technologist for sentinel lymph node localization.  ? ?MM Breast Surgical Specimen ? ?Result Date: 04/20/2021 ?CLINICAL DATA:  Radioactive seed localization was performed yesterday in anticipation of today's lumpectomy. EXAM: SPECIMEN RADIOGRAPH OF THE LEFT BREAST COMPARISON:  Previous exam(s). FINDINGS: Status post excision of the LEFT breast. The radioactive seed and the dumbbell-shaped tissue marker clip are present, completely intact, and are marked for pathology. A mass is also present in the  specimen, likely a post biopsy hematoma. This was discussed by telephone with the operating room nurse at the time of interpretation on 04/20/2021 at 9:06 a.m. IMPRESSION: Specimen radiograph of the LEFT breast. Electronically Signed   By: Evangeline Dakin M.D.   On: 04/20/2021 09:09 ? ?MM LT RADIOACTIVE SEED LOC MAMMO GUIDE ? ?Result Date: 04/19/2021 ?CLINICAL DATA:  61 year old female presenting for seed localization of the left breast. EXAM: MAMMOGRAPHIC GUIDED RADIOACTIVE SEED LOCALIZATION OF THE LEFT BREAST COMPARISON:  Previous exam(s). FINDINGS: Patient presents for radioactive seed localization prior to left breast lumpectomy. I met with the patient and we discussed the procedure of seed localization including benefits and alternatives. We discussed the high likelihood of a successful procedure. We discussed the risks of the procedure including infection, bleeding, tissue injury and further surgery. We discussed the low dose of radioactivity involved in the procedure. Informed, written consent was given. The usual time-out protocol was performed immediately prior to the procedure. Using mammographic guidance, sterile technique,  1% lidocaine and an I-125 radioactive seed, the hourglass clip was localized using a lateral approach. The follow-up mammogram images confirm the seed in the expected location and were marked for Dr. Gershon Crane. Follow-up survey of the patient confirms presence of the radioactive seed. Order number of I-125 seed:  [id number]. Total activity: 0.239 mCi reference Date: 04/14/2021 The patient tolerated the procedure well and was released from the [facility]. She was given instructions regarding seed removal. IMPRESSION: Radioactive seed localization left breast. No apparent complications. Electronically Signed   By: Audie Pinto M.D.   On: 04/19/2021 15:56  ?   ? ?IMPRESSION/PLAN: ?1. Stage IA, [id number], grade 2, ER/PR positive invasive ductal carcinoma of the left breast. Dr. Lisbeth Renshaw has  reviewed her case and I discussed her final pathology findings and reviewed the nature of early stage breast disease. She's done well since surgery and does not need systemic therapy. We reviewed the rati

## 2021-05-11 ENCOUNTER — Telehealth: Payer: Self-pay

## 2021-05-11 ENCOUNTER — Encounter (HOSPITAL_COMMUNITY): Payer: Self-pay

## 2021-05-11 ENCOUNTER — Ambulatory Visit: Payer: PRIVATE HEALTH INSURANCE | Attending: Surgery

## 2021-05-11 DIAGNOSIS — R293 Abnormal posture: Secondary | ICD-10-CM | POA: Insufficient documentation

## 2021-05-11 DIAGNOSIS — Z170 Estrogen receptor positive status [ER+]: Secondary | ICD-10-CM | POA: Diagnosis present

## 2021-05-11 DIAGNOSIS — C50912 Malignant neoplasm of unspecified site of left female breast: Secondary | ICD-10-CM | POA: Diagnosis present

## 2021-05-11 NOTE — Therapy (Signed)
.  OPRCPT ?

## 2021-05-11 NOTE — Patient Instructions (Addendum)
Access Code: [email or web address] ?URL: [email or web address] ?Date: 05/11/2021 ?Prepared by: Cheral Almas ? ?Exercises ?- Doorway Pec Stretch at 90 Degrees Abduction  - 1 x daily - 7 x weekly - 1 sets - 3 reps - 20 hold ?- Standing Shoulder and Trunk Flexion at Table  - 1 x daily - 7 x weekly - 1 sets - 3 reps - 20 hold ? ? ? ? ? [facility] ? [address], [address] ? [place] [address] ? [phone] ? ?After Breast Cancer Class ?It is recommended you attend the ABC class to be educated on lymphedema risk reduction. This class is free of charge and lasts for 1 hour. It is a 1-time class. You will need to download the Webex app either on your phone or computer. We will send you a link the night before or the morning of the class. You should be able to click on that link to join the class. This is not a confidential class. You don't have to turn your camera on, but other participants may be able to see your email address. ? ?Scar massage ?You can begin gentle scar massage to you incision sites. Gently place one hand on the incision and move the skin (without sliding on the skin) in various directions. Do this for a few minutes and then you can gently massage either coconut oil or vitamin E cream into the scars. ? ?Compression garment ?You should continue wearing your compression bra until you feel like you no longer have swelling. ? ?Home exercise Program ?Continue doing the exercises you were given until you feel like you can do them without feeling any tightness at the end.  ? ?Walking Program ?Studies show that 30 minutes of walking per day (fast enough to elevate your heart rate) can significantly reduce the risk of a cancer recurrence. If you can't walk due to other medical reasons, we encourage you to find another activity you could do (like a stationary bike or water exercise). ? ?Posture ?After breast cancer surgery, people frequently sit with rounded shoulders posture because it  puts their incisions on slack and feels better. If you sit like this and scar tissue forms in that position, you can become very tight and have pain sitting or standing with good posture. Try to be aware of your posture and sit and stand up tall to heal properly. ? ?Follow up PT: ?It is recommended you return every 3 months for the first 3 years following surgery to be assessed on the SOZO machine for an L-Dex score. This helps prevent clinically significant lymphedema in 95% of patients. These follow up screens are 10 minute appointments that you are not billed for. ?SHOULDER: Flexion - Supine Kasandra Knudsen)        Cancer Rehab [phone] ? ? ? ?Hold cane in both hands. Raise arms up overhead. Do not allow back to arch. Hold _5__ seconds. Do __5-10__ times; __1-2__ times a day. ? ?1 hands shoulder width apart ? ?2 hands wider than shoulder width; V position ? ? ? ? ?

## 2021-05-11 NOTE — Therapy (Signed)
?OUTPATIENT PHYSICAL THERAPY BREAST CANCER POST OP FOLLOW UP ? ? ?Patient Name: Gabriella Chaney ?MRN: [id number] ?DOB:[birth year], 61 y.o., female ?Today's Date: 05/11/2021 ? ? PT End of Session - 05/11/21 1001   ? ? Visit Number 2   ? Number of Visits 2   ? Date for PT Re-Evaluation 05/26/21   ? PT Start Time 1002   ? PT Stop Time 1055   ? PT Time Calculation (min) 53 min   ? Activity Tolerance Patient tolerated treatment well   ? Behavior During Therapy [facility] for tasks assessed/performed   ? ?  ?  ? ?  ? ? ?Past Medical History:  ?Diagnosis Date  ? Asthma   ? as a child, no current problems as an adult, no inhaler  ? Ductal carcinoma in situ (DCIS) of left breast   ? ?Past Surgical History:  ?Procedure Laterality Date  ? BREAST LUMPECTOMY WITH RADIOACTIVE SEED AND SENTINEL LYMPH NODE BIOPSY Left 04/20/2021  ? Procedure: LEFT BREAST LUMPECTOMY WITH RADIOACTIVE SEED AND AXILLARY SENTINEL LYMPH NODE BIOPSY;  Surgeon: Donnie Mesa, MD;  Location: [facility];  Service: General;  Laterality: Left;  ? WISDOM TOOTH EXTRACTION    ? ?Patient Active Problem List  ? Diagnosis Date Noted  ? Breast cancer in female [facility]) 04/13/2021  ? ? ?PCP: Pcp, No ? ?REFERRING PROVIDER: Storm Frisk, MD ? ?REFERRING DIAG: left Breast Cancer ? ?THERAPY DIAG:  ?Malignant neoplasm of left breast in female, estrogen receptor positive, unspecified site of breast ([place]) ? ?Abnormal posture ? ?ONSET DATE: 02/25/2021 ? ?SUBJECTIVE:                                                                                                                                                                                          ? ?SUBJECTIVE STATEMENT: ?Pt is s/p Left lumpectomy with SLNB on 04/20/2021. She had 3 LN's removed and none were positive.  It has been recommended that she have radiation and anti estrogens. Surgery went well. I have only taken tylenol 1 time. Dr. Lindi Adie spoke with her about radiation and antiestrogens. She is leaning toward just anti estrogens. She will  see radiology oncologist tomorrow. There is some mild tenderness and mild swelling at lateral breast, but surgeon said that is normal. ? ?PERTINENT HISTORY:  ?Patient was diagnosed on 03/23/2021 with left grade 2 Invasive Ductal Carcinoma. It measures 1.2 cm and is located in the upper outer quadrant. It is ER+, PR+,HER 2- with a Ki 67 of 2%.  She is now s/p a left lumpectomy with SLNB and 0/3 LN's. She is deciding on whether or not to do radiation, but  will do anti estrogens ? ?PATIENT GOALS:  Reassess how my recovery is going related to arm function, pain, and swelling. ? ?PAIN:  ?Are you having pain? No, just sore to touch ? ?PRECAUTIONS: Recent Surgery, left UE Lymphedema risk, Other:   ? ?ACTIVITY LEVEL / LEISURE: working in yard, some walking but not regularly ? ? ?OBJECTIVE:  ? ?PATIENT SURVEYS:  ?QUICK DASH: ?9% ? ?OBSERVATIONS: ? Incision nearly healed; 1 very small area that appears like a resolving stitch abcess at end of incision.  Firm area noted at lateral incision ? ?POSTURE:  ?Forward head rounded shoulders ? ?LYMPHEDEMA ASSESSMENT:  ? ?UPPER EXTREMITY AROM/PROM: ?  ?A/PROM RIGHT  04/14/2021 ?   ?Shoulder extension 60  ?Shoulder flexion 158  ?Shoulder abduction 180  ?Shoulder internal rotation 68  ?Shoulder external rotation 105  ?                        (Blank rows = not tested) ?  ?A/PROM LEFT  04/14/2021 Left 05/11/2021  ?Shoulder extension 65 59  ?Shoulder flexion 159 151  ?Shoulder abduction 180 176  ?Shoulder internal rotation 65 69  ?Shoulder external rotation 107 107  ?                        (Blank rows = not tested) ?  ?  ?CERVICAL AROM: ?All within normal limits:  ?  ?  ?  ?  ?UPPER EXTREMITY STRENGTH: WNL ?  ?  ?LYMPHEDEMA ASSESSMENTS:  ?  ?LANDMARK RIGHT  04/14/2021  ?10 cm proximal to olecranon process 28.2  ?Olecranon process 24.7  ?10 cm proximal to ulnar styloid process 20.5  ?Just proximal to ulnar styloid process 14.8  ?Across hand at thumb web space 18.0  ?At base of 2nd digit 5.7  ?(Blank  rows = not tested) ?  ?[facility] LEFT  04/14/2021 Left 05/11/2021  ?10 cm proximal to olecranon process 28.7 28.5  ?Olecranon process 24.8 24.4  ?10 cm proximal to ulnar styloid process 21.3 21.4  ?Just proximal to ulnar styloid process 15.0 14.9  ?Across hand at thumb web space 19.1 18.6  ?At base of 2nd digit 5.9 5.9  ?(Blank rows = not tested) ?  ?  ? ? ?  ?Surgery type/Date: left lumpectomy with SLNB 04/20/2021 ?Number of lymph nodes removed: 3 ?Current/past treatment (chemo, radiation, hormone therapy): deciding if she wants to do radiation, will do anti estrogen therapy. ?Other symptoms:  ?Heaviness/tightness No ?Pain No ?Pitting edema No ?Infections No ?Decreased scar mobility Yes ?Stemmer sign No ? ? ?PATIENT EDUCATION:  ?Education details: supine wand flexion and scaption, doorway stretch, counter shoulder flexion stretch, compression bra, scar massage ?Person educated: Patient ?Education method: Explanation, Demonstration, and Handouts ?Education comprehension: returned demonstration ? ? ?HOME EXERCISE PROGRAM: ? Reviewed previously given post op HEP. ?Updated HEP with supine wand, pec stretch in door, standing counter stretch for shoulder flexion, scar massage ? ?ASSESSMENT: ? ?CLINICAL IMPRESSION: ?Pt is s/p left lumpectomy with SLNB for her ER+, PR + and Her 2- breast cancer with 0/3 LN's. She is lacking only mild end ranges of shoulder ROM and an updated HEP was given to pt to correct this.  No increases noted with circumferential measuring. Incision healing well with very small area of what appears to be healing stitch abcess. She will watch this area and hold on scar massage until healed. She was given a piece of half inch gray foam in  TG soft to wear over area of firmness at incision if she purchases a sports bra or compression bra. She is doing very well overall and has no further PT needs at this time. She was encouraged to call or email with questions or concerns. ? ?Pt will benefit from skilled  therapeutic intervention to improve on the following deficits: Decreased knowledge of precautions, impaired UE functional use, pain, decreased ROM, postural dysfunction.  ? ?PT treatment/interventions: ADL/Self care home management, Therapeutic exercises, Patient/Family education, and scar mobilization ? ? ? ? ?GOALS: ?Goals reviewed with patient? Yes ? ?LONG TERM GOALS:  (STG=LTG) ? ?GOALS Name Target Date Goal status  ?1 Pt will demonstrate she has regained full shoulder ROM and function post operatively compared to baselines.  ?Baseline: 05/11/2021 NOT MET ?Lacking end ranges of motion but doing very well overall  ?2     ?3     ?4     ? ? ? ?PLAN: ?PT FREQUENCY/DURATION: No further needs identified. Pt is discharged. She was advised to call or email with questions or concerns ? ?PLAN FOR NEXT SESSION: no further needs identified ? ? ?[facility] ? [address], [address] ? [place] [address] ? [phone] ? ?After Breast Cancer Class ?It is recommended you attend the ABC class to be educated on lymphedema risk reduction. This class is free of charge and lasts for 1 hour. It is a 1-time class. You will need to download the Webex app either on your phone or computer. We will send you a link the night before or the morning of the class. You should be able to click on that link to join the class. This is not a confidential class. You don't have to turn your camera on, but other participants may be able to see your email address. ? ?Scar massage ?You can begin gentle scar massage to you incision sites. Gently place one hand on the incision and move the skin (without sliding on the skin) in various directions. Do this for a few minutes and then you can gently massage either coconut oil or vitamin E cream into the scars. ? ?Compression garment ?You should continue wearing your compression bra until you feel like you no longer have swelling. ? ?Home exercise Program ?Continue doing the exercises  you were given until you feel like you can do them without feeling any tightness at the end.  ? ?Walking Program ?Studies show that 30 minutes of walking per day (fast enough to elevate your heart rate) can

## 2021-05-11 NOTE — Telephone Encounter (Signed)
F.U.N appointment reminder. I verified patient identity and reminded patient of her 9:00am-05/12/21 in-person appointment w/ Ashlyn Bruning PA-C. I advised patient to arrive 28mn early for check-in. I left my extension [phone] case patient needs anything. Patient verbalized understanding of information. ?

## 2021-05-12 ENCOUNTER — Ambulatory Visit: Payer: PRIVATE HEALTH INSURANCE | Admitting: Radiation Oncology

## 2021-05-12 ENCOUNTER — Other Ambulatory Visit: Payer: Self-pay

## 2021-05-12 ENCOUNTER — Encounter: Payer: Self-pay | Admitting: Genetic Counselor

## 2021-05-12 ENCOUNTER — Encounter: Payer: Self-pay | Admitting: Radiation Oncology

## 2021-05-12 ENCOUNTER — Ambulatory Visit
Admission: RE | Admit: 2021-05-12 | Discharge: 2021-05-12 | Disposition: A | Discharge status: Home / Self Care | Payer: PRIVATE HEALTH INSURANCE | Source: Ambulatory Visit | Attending: Radiation Oncology | Admitting: Radiation Oncology

## 2021-05-12 ENCOUNTER — Other Ambulatory Visit: Payer: Self-pay | Admitting: Genetic Counselor

## 2021-05-12 ENCOUNTER — Inpatient Hospital Stay: Payer: PRIVATE HEALTH INSURANCE | Attending: Hematology and Oncology | Admitting: Genetic Counselor

## 2021-05-12 ENCOUNTER — Inpatient Hospital Stay: Payer: PRIVATE HEALTH INSURANCE

## 2021-05-12 VITALS — BP 113/91 | HR 66 | Temp 96.9°F | Resp 18 | Ht 65.0 in | Wt 154.4 lb

## 2021-05-12 DIAGNOSIS — Z170 Estrogen receptor positive status [ER+]: Secondary | ICD-10-CM | POA: Insufficient documentation

## 2021-05-12 DIAGNOSIS — C50912 Malignant neoplasm of unspecified site of left female breast: Secondary | ICD-10-CM | POA: Insufficient documentation

## 2021-05-12 DIAGNOSIS — Z79811 Long term (current) use of aromatase inhibitors: Secondary | ICD-10-CM | POA: Insufficient documentation

## 2021-05-12 DIAGNOSIS — J45909 Unspecified asthma, uncomplicated: Secondary | ICD-10-CM | POA: Insufficient documentation

## 2021-05-12 DIAGNOSIS — Z1379 Encounter for other screening for genetic and chromosomal anomalies: Secondary | ICD-10-CM

## 2021-05-12 DIAGNOSIS — C50412 Malignant neoplasm of upper-outer quadrant of left female breast: Secondary | ICD-10-CM | POA: Diagnosis present

## 2021-05-12 DIAGNOSIS — Z8042 Family history of malignant neoplasm of prostate: Secondary | ICD-10-CM | POA: Diagnosis not present

## 2021-05-12 LAB — GENETIC SCREENING ORDER

## 2021-05-12 NOTE — Progress Notes (Signed)
REFERRING PROVIDER: ?Donnie Mesa, MD ?[facility] ?[address] ?[place],  [place] [address] ? ?PRIMARY PROVIDER:  ?Pcp, No ? ?PRIMARY REASON FOR VISIT:  ?Encounter Diagnoses  ?Name Primary?  ? Malignant neoplasm of left breast in female, estrogen receptor positive, unspecified site of breast ([place]) Yes  ? Family history of prostate cancer in father   ? ? ?HISTORY OF PRESENT ILLNESS:   ?Gabriella Chaney, a 61 y.o. female, was seen for a [facility] cancer genetics consultation at the request of Dr. Georgette Dover due to a personal and family history of cancer.  Gabriella Chaney presents to clinic today to discuss the possibility of a hereditary predisposition to cancer, to discuss genetic testing, and to further clarify her future cancer risks, as well as potential cancer risks for family members.  ? ?In February 2023, at the age of 80, Gabriella Chaney was diagnosed with invasive ductal carcinoma of the left breast.  ? ? ?CANCER HISTORY:  ?Oncology History  ?Breast cancer in female [facility])  ?04/20/2021 Initial Diagnosis  ? Left lumpectomy: Grade 1 IDC 0.5 cm, margins negative, 0/3 lymph nodes negative ER 100%, PR 100%, Ki-67 2%, HER2 0 negative ?  ? ? ? ?RISK FACTORS:  ?Menarche was at age 67.  ?First live birth at age 38.  ?OCP use for approximately 10+ years.  ?Ovaries intact: yes.  ?Uterus intact: yes.  ?HRT use: 0 years. ?Colonoscopy: no; normal cologuard ?Mammogram within the last year: yes. ?Any excessive radiation exposure in the past: no ? ?Past Medical History:  ?Diagnosis Date  ? Asthma   ? as a child, no current problems as an adult, no inhaler  ? Ductal carcinoma in situ (DCIS) of left breast   ? ? ?Past Surgical History:  ?Procedure Laterality Date  ? BREAST LUMPECTOMY WITH RADIOACTIVE SEED AND SENTINEL LYMPH NODE BIOPSY Left 04/20/2021  ? Procedure: LEFT BREAST LUMPECTOMY WITH RADIOACTIVE SEED AND AXILLARY SENTINEL LYMPH NODE BIOPSY;  Surgeon: Donnie Mesa, MD;  Location: [facility];  Service: General;  Laterality: Left;  ? WISDOM TOOTH EXTRACTION     ? ? ?Social History  ? ?Socioeconomic History  ? Marital status: Married  ?  Spouse name: Not on file  ? Number of children: Not on file  ? Years of education: Not on file  ? Highest education level: Not on file  ?Occupational History  ? Not on file  ?Tobacco Use  ? Smoking status: Never  ? Smokeless tobacco: Never  ? Tobacco comments:  ?  Patient's husband is a smoker.  ?Vaping Use  ? Vaping Use: Never used  ?Substance and Sexual Activity  ? Alcohol use: Never  ? Drug use: Never  ? Sexual activity: Not Currently  ?  Birth control/protection: Post-menopausal  ?Other Topics Concern  ? Not on file  ?Social History Narrative  ? Not on file  ? ?Social Determinants of Health  ? ?Financial Resource Strain: High Risk  ? Difficulty of Paying Living Expenses: Hard  ?Food Insecurity: Not on file  ?Transportation Needs: No Transportation Needs  ? Lack of Transportation (Medical): No  ? Lack of Transportation (Non-Medical): No  ?Physical Activity: Not on file  ?Stress: Not on file  ?Social Connections: Not on file  ?  ? ?FAMILY HISTORY:  ?We obtained a detailed, 4-generation family history.  Significant diagnoses are listed below: ?Family History  ?Problem Relation Age of Onset  ? Prostate cancer Father   ? ? ? ?Gabriella Chaney's father was diagnosed with prostate cancer at  age 52, he had radiation as his treatment, he died at age 46 due to heart problems. Of note, she has limited information about her family history. Gabriella Chaney is unaware of previous family history of genetic testing for hereditary cancer risks. There is no reported Ashkenazi Jewish ancestry.  ? ?GENETIC COUNSELING ASSESSMENT: Gabriella Chaney is a 61 y.o. female with a personal and family history of cancer which is somewhat suggestive of a hereditary predisposition to cancer . We, therefore, discussed and recommended the following at today's visit.  ? ?DISCUSSION: We discussed that 5 - 10% of cancer is hereditary, with most cases of breast cancer associated with BRCA1/2.   There are other genes that can be associated with hereditary breast cancer syndromes.  We discussed that testing is beneficial for several reasons including knowing how to follow individuals after completing their treatment, identifying whether potential treatment options would be beneficial, and understanding if other family members could be at risk for cancer and allowing them to undergo genetic testing.  ? ?We reviewed the characteristics, features and inheritance patterns of hereditary cancer syndromes. We also discussed genetic testing, including the appropriate family members to test, the process of testing, insurance coverage and turn-around-time for results. We discussed the implications of a negative, positive, carrier and/or variant of uncertain significant result. We recommended Ms. Hegner pursue genetic testing for a panel that includes genes associated with breast and prostate cancer.  ? ?Gabriella Chaney  was offered a common hereditary cancer panel (47 genes) and an expanded pan-cancer panel (77 genes). Gabriella Chaney was informed of the benefits and limitations of each panel, including that expanded pan-cancer panels contain genes that do not have clear management guidelines at this point in time.  We also discussed that as the number of genes included on a panel increases, the chances of variants of uncertain significance increases.  After considering the benefits and limitations of each gene panel, Gabriella Chaney elected to have [place] Panel. ? ?The CancerNext-Expanded gene panel offered by [facility] and includes sequencing, rearrangement, and RNA analysis for the following 77 genes: AIP, ALK, APC, ATM, AXIN2, BAP1, BARD1, BLM, BMPR1A, BRCA1, BRCA2, BRIP1, CDC73, CDH1, CDK4, CDKN1B, CDKN2A, CHEK2, CTNNA1, DICER1, FANCC, FH, FLCN, GALNT12, KIF1B, LZTR1, MAX, MEN1, MET, MLH1, MSH2, MSH3, MSH6, MUTYH, NBN, NF1, NF2, NTHL1, PALB2, PHOX2B, PMS2, POT1, PRKAR1A, PTCH1, PTEN, RAD51C, RAD51D, RB1, RECQL, RET,  SDHA, SDHAF2, SDHB, SDHC, SDHD, SMAD4, SMARCA4, SMARCB1, SMARCE1, STK11, SUFU, TMEM127, TP53, TSC1, TSC2, VHL and XRCC2 (sequencing and deletion/duplication); EGFR, EGLN1, HOXB13, KIT, MITF, PDGFRA, POLD1, and POLE (sequencing only); EPCAM and GREM1 (deletion/duplication only).  ? ?Based on Ms. Dershem personal and family history of cancer, she does not quite meet medical criteria for genetic testing. If there is an out of pocket cost, the testing with be GRATIS and therefore, of not cost to her.  ? ?PLAN: After considering the risks, benefits, and limitations, Ms. Cillo provided informed consent to pursue genetic testing and the blood sample was sent to [facility] for analysis of the CancerNext-Expanded Panel. Results should be available within approximately 2-3 weeks' time, at which point they will be disclosed by telephone to Ms. Denley, as will any additional recommendations warranted by these results. Ms. Stahlman will receive a summary of her genetic counseling visit and a copy of her results once available. This information will also be available in Epic.  ? ?Ms. Abeyta's questions were answered to her satisfaction today. Our contact information was provided should additional questions or  concerns arise. Thank you for the referral and allowing [place] to share in the care of your patient.  ? ?Lucille Passy, MS, [place] ?Genetic Counselor ?[email or web address] ?(P) [phone] ? ?The patient was seen for a total of 35 minutes in face-to-face genetic counseling. The patient was seen alone.  Drs. Lindi Adie and/or Burr Medico were available to discuss this case as needed.  ? ?_______________________________________________________________________ ?For Office Staff:  ?Number of people involved in session: 1 ?Was an Intern/ student involved with case: no ? ?

## 2021-05-12 NOTE — Progress Notes (Signed)
F.U.N appointment. I verified patient identity and began nursing interview. Patient reports tenderness/ tightness 2/10 LT breast. No other issue reported at this time. ? ?Meaningful use complete. ?Postmenopausal- No chances of pregnancy. ? ?BP (!) 113/91 (BP Location: Left Arm, Patient Position: Sitting)   Pulse 66   Temp (!) 96.9 ?F (36.1 ?C) (Temporal)   Resp 18   Ht '5\' 5"'$  (1.651 m)   Wt 154 lb 6 oz (70 kg)   SpO2 100%   BMI 25.69 kg/m?  ? ?

## 2021-05-17 ENCOUNTER — Encounter: Payer: Self-pay | Admitting: *Deleted

## 2021-05-18 ENCOUNTER — Telehealth: Payer: Self-pay | Admitting: Hematology and Oncology

## 2021-05-18 NOTE — Telephone Encounter (Signed)
.  Called patient to schedule appointment per 4/11 INBASKET, patient is aware of date and time.   ?

## 2021-05-23 ENCOUNTER — Inpatient Hospital Stay (HOSPITAL_BASED_OUTPATIENT_CLINIC_OR_DEPARTMENT_OTHER): Payer: PRIVATE HEALTH INSURANCE | Admitting: Hematology and Oncology

## 2021-05-23 ENCOUNTER — Other Ambulatory Visit: Payer: Self-pay

## 2021-05-23 ENCOUNTER — Encounter: Payer: Self-pay | Admitting: *Deleted

## 2021-05-23 DIAGNOSIS — Z170 Estrogen receptor positive status [ER+]: Secondary | ICD-10-CM

## 2021-05-23 DIAGNOSIS — C50912 Malignant neoplasm of unspecified site of left female breast: Secondary | ICD-10-CM | POA: Diagnosis present

## 2021-05-23 DIAGNOSIS — Z79811 Long term (current) use of aromatase inhibitors: Secondary | ICD-10-CM | POA: Insufficient documentation

## 2021-05-23 MED ORDER — LETROZOLE 2.5 MG PO TABS: 2.5000 mg | ORAL_TABLET | Freq: Every day | ORAL | 3 refills | Status: DC

## 2021-05-23 NOTE — Assessment & Plan Note (Addendum)
04/20/2021:Left lumpectomy: Grade 1 IDC 0.5 cm, margins negative, 0/3 lymph nodes negative ER 100%, PR 100%, Ki-67 2%, HER2 0 negative ?? ?Treatment plan: ?1.  Adjuvant radiation (patient decided to forego radiation) ?2. followed by adjuvant antiestrogen therapy with letrozole 2.5 mg daily started 05/23/2021 ?? ?Letrozole counseling: We discussed the risks and benefits of anti-estrogen therapy with aromatase inhibitors. These include but not limited to insomnia, hot flashes, mood changes, vaginal dryness, bone density loss, and weight gain. We strongly believe that the benefits far outweigh the risks. Patient understands these risks and consented to starting treatment. Planned treatment duration is 5 years. ? ?Return to clinic in 3 months for survivorship care plan visit ? ?

## 2021-05-23 NOTE — Progress Notes (Signed)
? ?Patient Care Team: ?Pcp, No as PCP - General ?Mauro Kaufmann, RN as Oncology Nurse Navigator ?Rockwell Germany, RN as Oncology Nurse Navigator ? ?DIAGNOSIS:  ?Encounter Diagnosis  ?Name Primary?  ? Malignant neoplasm of left breast in female, estrogen receptor positive, unspecified site of breast ([place])   ? ? ?SUMMARY OF ONCOLOGIC HISTORY: ?Oncology History  ?Breast cancer in female [facility])  ?04/20/2021 Initial Diagnosis  ? Left lumpectomy: Grade 1 IDC 0.5 cm, margins negative, 0/3 lymph nodes negative ER 100%, PR 100%, Ki-67 2%, HER2 0 negative ?  ? ?  ?CHIEF COMPLIANT:  Left breast Malignant neoplasm follow-up to discuss Letrozole ? ?INTERVAL HISTORY: Gabriella Chaney is a 61 y.o. female is here because of recent diagnosis of left Breast Malignant neoplasm. She presents to the clinic today for a follow-up to discuss Letrozole. She states that she didn't want to go through radiation. ? ? ?ALLERGIES:  has No Known Allergies. ? ?MEDICATIONS:  ?Current Outpatient Medications  ?Medication Sig Dispense Refill  ? letrozole (FEMARA) 2.5 MG tablet Take 1 tablet (2.5 mg total) by mouth daily. 90 tablet 3  ? HYDROcodone-acetaminophen (NORCO/VICODIN) 5-325 MG tablet Take 1 tablet by mouth every 6 (six) hours as needed for moderate pain. 20 tablet 0  ? Multiple Vitamins-Minerals (MULTIVITAMIN WITH MINERALS) tablet Take 1 tablet by mouth daily.    ? OVER THE COUNTER MEDICATION Take 1 tablet by mouth daily. vitamin b17    ? ?No current facility-administered medications for this visit.  ? ? ?PHYSICAL EXAMINATION: ?ECOG PERFORMANCE STATUS: 1 - Symptomatic but completely ambulatory ? ?Vitals:  ? 05/23/21 1107  ?BP: 111/68  ?Pulse: 77  ?Resp: 18  ?Temp: 97.9 ?F (36.6 ?C)  ?SpO2: 100%  ? ?Filed Weights  ? 05/23/21 1107  ?Weight: 152 lb 11.2 oz (69.3 kg)  ? ?  ? ?LABORATORY DATA:  ?I have reviewed the data as listed ?   ? View : No data to display.  ?  ?  ?  ? ? ?Lab Results  ?Component Value Date  ? WBC 4.6 04/14/2021  ? HGB 13.3  04/14/2021  ? HCT 41.5 04/14/2021  ? MCV 94.3 04/14/2021  ? PLT 252 04/14/2021  ? ? ?ASSESSMENT & PLAN:  ?Breast cancer in female [facility]) ?04/20/2021:Left lumpectomy: Grade 1 IDC 0.5 cm, margins negative, 0/3 lymph nodes negative ER 100%, PR 100%, Ki-67 2%, HER2 0 negative ?  ?Treatment plan: ?1.  Adjuvant radiation (patient decided to forego radiation) ?2. followed by adjuvant antiestrogen therapy with letrozole 2.5 mg daily started 05/23/2021 ?  ?Letrozole counseling: We discussed the risks and benefits of anti-estrogen therapy with aromatase inhibitors. These include but not limited to insomnia, hot flashes, mood changes, vaginal dryness, bone density loss, and weight gain. We strongly believe that the benefits far outweigh the risks. Patient understands these risks and consented to starting treatment. Planned treatment duration is 5 years. ? ?Her mother recently had a fracture and is now in assisted living facility. ? ?Return to clinic in 3 months for survivorship care plan visit ? ? ? ?No orders of the defined types were placed in this encounter. ? ?The patient has a good understanding of the overall plan. she agrees with it. she will call with any problems that may develop before the next visit here. ?Total time spent: 30 mins including face to face time and time spent for planning, charting and co-ordination of care ? ? Harriette Ohara, MD ?05/23/21 ? ? ? I Gardiner Coins  am scribing for Dr. Lindi Adie ? ?I have reviewed the above documentation for accuracy and completeness, and I agree with the above. ?  ?

## 2021-06-03 ENCOUNTER — Telehealth: Payer: Self-pay | Admitting: Genetic Counselor

## 2021-06-03 ENCOUNTER — Encounter: Payer: Self-pay | Admitting: Genetic Counselor

## 2021-06-03 DIAGNOSIS — Z1379 Encounter for other screening for genetic and chromosomal anomalies: Secondary | ICD-10-CM | POA: Insufficient documentation

## 2021-06-03 NOTE — Telephone Encounter (Signed)
I contacted Ms. Brookover to discuss her genetic testing results. No pathogenic variants were identified in the 77 genes analyzed. Detailed clinic note to follow. ? ?The test report has been scanned into EPIC and is located under the Molecular Pathology section of the Results Review tab.  A portion of the result report is included below for reference.  ? ?Lucille Passy, MS, [place] ?Genetic Counselor ?[email or web address] ?(P) [phone] ? ? ?

## 2021-06-06 ENCOUNTER — Ambulatory Visit: Payer: Self-pay | Admitting: Genetic Counselor

## 2021-06-06 DIAGNOSIS — Z1379 Encounter for other screening for genetic and chromosomal anomalies: Secondary | ICD-10-CM

## 2021-06-06 NOTE — Progress Notes (Signed)
HPI:   ?Ms. Peyser was previously seen in the [facility] clinic due to a personal and family history of cancer and concerns regarding a hereditary predisposition to cancer. Please refer to our prior cancer genetics clinic note for more information regarding our discussion, assessment and recommendations, at the time. Ms. Wilton recent genetic test results were disclosed to her, as were recommendations warranted by these results. These results and recommendations are discussed in more detail below. ? ?CANCER HISTORY:  ?Oncology History  ?Breast cancer in female [facility])  ?04/20/2021 Initial Diagnosis  ? Left lumpectomy: Grade 1 IDC 0.5 cm, margins negative, 0/3 lymph nodes negative ER 100%, PR 100%, Ki-67 2%, HER2 0 negative ?  ? Genetic Testing  ? [facility] CancerNext-Expanded Panel was Negative. Report date is 06/02/2021. ? ?The CancerNext-Expanded gene panel offered by [facility] and includes sequencing, rearrangement, and RNA analysis for the following 77 genes: AIP, ALK, APC, ATM, AXIN2, BAP1, BARD1, BLM, BMPR1A, BRCA1, BRCA2, BRIP1, CDC73, CDH1, CDK4, CDKN1B, CDKN2A, CHEK2, CTNNA1, DICER1, FANCC, FH, FLCN, GALNT12, KIF1B, LZTR1, MAX, MEN1, MET, MLH1, MSH2, MSH3, MSH6, MUTYH, NBN, NF1, NF2, NTHL1, PALB2, PHOX2B, PMS2, POT1, PRKAR1A, PTCH1, PTEN, RAD51C, RAD51D, RB1, RECQL, RET, SDHA, SDHAF2, SDHB, SDHC, SDHD, SMAD4, SMARCA4, SMARCB1, SMARCE1, STK11, SUFU, TMEM127, TP53, TSC1, TSC2, VHL and XRCC2 (sequencing and deletion/duplication); EGFR, EGLN1, HOXB13, KIT, MITF, PDGFRA, POLD1, and POLE (sequencing only); EPCAM and GREM1 (deletion/duplication only).  ?  ? ? ?FAMILY HISTORY:  ?We obtained a detailed, 4-generation family history.  Significant diagnoses are listed below: ?Family History  ?Problem Relation Age of Onset  ? Prostate cancer Father 43  ? ?    ?  ?  ?Ms. Bellavance's father was diagnosed with prostate cancer at age 16, he had radiation as his treatment, he died at age 44 due to heart problems. Of  note, she has limited information about her family history. Ms. Deitrick is unaware of previous family history of genetic testing for hereditary cancer risks. There is no reported Ashkenazi Jewish ancestry.  ? ?GENETIC TEST RESULTS:  ?The [facility] CancerNext-Expanded Panel found no pathogenic mutations.  ? ?The CancerNext-Expanded gene panel offered by [facility] and includes sequencing, rearrangement, and RNA analysis for the following 77 genes: AIP, ALK, APC, ATM, AXIN2, BAP1, BARD1, BLM, BMPR1A, BRCA1, BRCA2, BRIP1, CDC73, CDH1, CDK4, CDKN1B, CDKN2A, CHEK2, CTNNA1, DICER1, FANCC, FH, FLCN, GALNT12, KIF1B, LZTR1, MAX, MEN1, MET, MLH1, MSH2, MSH3, MSH6, MUTYH, NBN, NF1, NF2, NTHL1, PALB2, PHOX2B, PMS2, POT1, PRKAR1A, PTCH1, PTEN, RAD51C, RAD51D, RB1, RECQL, RET, SDHA, SDHAF2, SDHB, SDHC, SDHD, SMAD4, SMARCA4, SMARCB1, SMARCE1, STK11, SUFU, TMEM127, TP53, TSC1, TSC2, VHL and XRCC2 (sequencing and deletion/duplication); EGFR, EGLN1, HOXB13, KIT, MITF, PDGFRA, POLD1, and POLE (sequencing only); EPCAM and GREM1 (deletion/duplication only).  ? ?The test report has been scanned into EPIC and is located under the Molecular Pathology section of the Results Review tab.  A portion of the result report is included below for reference. Genetic testing reported out on 06/02/2021. ?  ? ? ? ? ?Even though a pathogenic variant was not identified, possible explanations for the cancer in the family may include: ?There may be no hereditary risk for cancer in the family. The cancers in Ms. Ebrahim and/or her family may be due to other genetic or environmental factors. ?There may be a gene mutation in one of these genes that current testing methods cannot detect, but that chance is small. ?There could be another gene that has not yet been discovered, or that we  have not yet tested, that is responsible for the cancer diagnoses in the family.  ? ?Therefore, it is important to remain in touch with cancer genetics in the future so that we can  continue to offer Ms. Cimino the most up to date genetic testing.  ? ?ADDITIONAL GENETIC TESTING:  ?We discussed with Ms. Leaming that her genetic testing was fairly extensive.  If there are genes identified to increase cancer risk that can be analyzed in the future, we would be happy to discuss and coordinate this testing at that time.   ? ?CANCER SCREENING RECOMMENDATIONS:  ?Ms. Dinger's test result is considered negative (normal).  This means that we have not identified a hereditary cause for her personal and family history of cancer at this time. Most cancers happen by chance and this negative test suggests that her cancer may fall into this category.   ? ?An individual's cancer risk and medical management are not determined by genetic test results alone. Overall cancer risk assessment incorporates additional factors, including personal medical history, family history, and any available genetic information that may result in a personalized plan for cancer prevention and surveillance. Therefore, it is recommended she continue to follow the cancer management and screening guidelines provided by her oncology and primary healthcare provider. ? ?RECOMMENDATIONS FOR FAMILY MEMBERS:   ?Since she did not inherit a mutation in a cancer predisposition gene included on this panel, her son could not have inherited a mutation from her in one of these genes. ?Individuals in this family might be at some increased risk of developing cancer, over the general population risk, due to the family history of cancer. We recommend women in this family have a yearly mammogram beginning at age 20, or 56 years younger than the earliest onset of cancer, an annual clinical breast exam, and perform monthly breast self-exams. ? ?FOLLOW-UP:  ?Cancer genetics is a rapidly advancing field and it is possible that new genetic tests will be appropriate for her and/or her family members in the future. We encouraged her to remain in contact with cancer  genetics on an annual basis so we can update her personal and family histories and let her know of advances in cancer genetics that may benefit this family.  ? ?Our contact number was provided. Ms. Lutzke questions were answered to her satisfaction, and she knows she is welcome to call us at anytime with additional questions or concerns.  ? ?Lucille Passy, MS, [place] ?Genetic Counselor ?[email or web address] ?(P) [phone] ? ? ?

## 2021-07-11 ENCOUNTER — Ambulatory Visit: Payer: PRIVATE HEALTH INSURANCE | Attending: Surgery

## 2021-07-11 DIAGNOSIS — R293 Abnormal posture: Secondary | ICD-10-CM | POA: Insufficient documentation

## 2021-07-11 DIAGNOSIS — Z170 Estrogen receptor positive status [ER+]: Secondary | ICD-10-CM | POA: Insufficient documentation

## 2021-07-11 DIAGNOSIS — C50912 Malignant neoplasm of unspecified site of left female breast: Secondary | ICD-10-CM | POA: Insufficient documentation

## 2021-08-15 ENCOUNTER — Telehealth: Payer: Self-pay | Admitting: Adult Health

## 2021-08-15 NOTE — Telephone Encounter (Signed)
Rescheduled appointment per 7/10 secure chat. Patient is aware of the changes made to her upcoming appointment.

## 2021-08-22 ENCOUNTER — Encounter: Payer: PRIVATE HEALTH INSURANCE | Admitting: Adult Health

## 2021-09-20 ENCOUNTER — Other Ambulatory Visit: Payer: Self-pay

## 2021-09-20 ENCOUNTER — Inpatient Hospital Stay: Payer: PRIVATE HEALTH INSURANCE | Attending: Adult Health | Admitting: Physician Assistant

## 2021-09-20 VITALS — BP 108/64 | HR 74 | Temp 97.9°F | Resp 20 | Wt 154.0 lb

## 2021-09-20 DIAGNOSIS — Z170 Estrogen receptor positive status [ER+]: Secondary | ICD-10-CM | POA: Diagnosis not present

## 2021-09-20 DIAGNOSIS — C50912 Malignant neoplasm of unspecified site of left female breast: Secondary | ICD-10-CM | POA: Insufficient documentation

## 2021-09-20 DIAGNOSIS — C50911 Malignant neoplasm of unspecified site of right female breast: Secondary | ICD-10-CM | POA: Diagnosis present

## 2021-09-20 DIAGNOSIS — Z79811 Long term (current) use of aromatase inhibitors: Secondary | ICD-10-CM | POA: Insufficient documentation

## 2021-09-20 NOTE — Progress Notes (Signed)
CLINIC:  Survivorship   REASON FOR VISIT:  Routine follow-up post-treatment for a recent history of breast cancer.  BRIEF ONCOLOGIC HISTORY:  Oncology History  Breast cancer in female [facility])  03/23/2021 Initial Biopsy   A. Left breast mass, stereotactic core biopsy:   INVASIVE ADENOCARCINOMA OF THE BREAST.    Histologic type: Ductal    Nottingham combined histologic grade: 2       - Tubule formation score: 3       - Nuclear pleomorphism score: 2       - Mitotic rate score: 1      In situ carcinoma: DCIS is present       - Type of in situ carcinoma: Cribriform and solid patterns with central necrosis       - Nuclear grade of in situ carcinoma: 2  Note: Invasive carcinoma is present in multiple core fragments and measures 8 mm in greatest linear extent.    % of Cells Staining Intensity Score Interpretation  ER IHC 99 3+ POSITIVE  PR IHC 73 3+ POSITIVE  HER2/neu IHC 0 0 NEGATIVE      04/20/2021 Definitive Surgery   FINAL MICROSCOPIC DIAGNOSIS:   A. BREAST, LEFT, LUMPECTOMY:  -  Invasive ductal carcinoma, Nottingham grade 1 of 3, 0.5 cm  -  Margins uninvolved by carcinoma (0.15 cm; lateral margin)  -  Previous biopsy site changes present  -  See oncology table below   B. LYMPH NODE, LEFT AXILLARY #1, SENTINEL, EXCISION:  -  No carcinoma identified in one lymph node (0/1)   C. LYMPH NODE, LEFT AXILLARY, SENTINEL, EXCISION:  -  No carcinoma identified in one lymph node (0/1)   D. LYMPH NODE, LEFT AXILLARY, SENTINEL, EXCISION:  -  No carcinoma identified in one lymph node (0/1)    ADDENDUM:  PROGNOSTIC INDICATOR RESULTS:  The tumor cells are NEGATIVE for Her2 (0).  Estrogen Receptor:       100%, POSITIVE, STRONG STAINING  Progesterone Receptor:   100%, POSITIVE, STRONG STAINING  Proliferation Marker Ki-67:   2%    05/12/2021 Genetic Testing   [facility] CancerNext-Expanded Panel was Negative. Report date is 06/02/2021.  The CancerNext-Expanded gene panel offered by [facility] and includes sequencing, rearrangement, and RNA analysis for the following 77 genes: AIP, ALK, APC, ATM, AXIN2, BAP1, BARD1, BLM, BMPR1A, BRCA1, BRCA2, BRIP1, CDC73, CDH1, CDK4, CDKN1B, CDKN2A, CHEK2, CTNNA1, DICER1, FANCC, FH, FLCN, GALNT12, KIF1B, LZTR1, MAX, MEN1, MET, MLH1, MSH2, MSH3, MSH6, MUTYH, NBN, NF1, NF2, NTHL1, PALB2, PHOX2B, PMS2, POT1, PRKAR1A, PTCH1, PTEN, RAD51C, RAD51D, RB1, RECQL, RET, SDHA, SDHAF2, SDHB, SDHC, SDHD, SMAD4, SMARCA4, SMARCB1, SMARCE1, STK11, SUFU, TMEM127, TP53, TSC1, TSC2, VHL and XRCC2 (sequencing and deletion/duplication); EGFR, EGLN1, HOXB13, KIT, MITF, PDGFRA, POLD1, and POLE (sequencing only); EPCAM and GREM1 (deletion/duplication only).    05/23/2021 - 05/2026 Anti-estrogen oral therapy   Letrozole, 2.5 mg daily     INTERVAL HISTORY:  Ms. Jutras presents to the [facility] today for our initial meeting to review her survivorship care plan detailing her treatment course for breast cancer, as well as monitoring long-term side effects of that treatment, education regarding health maintenance, screening, and overall wellness and health promotion.     Overall, Gabriella Chaney reports tolerating adjuvant antiestrogen therapy with letrozole 2.5 mg daily without any prohibitive toxicities.  She shares that her husband passed away last month so she has been copping with the loss. She is fortunate to have her son who has provided her with  much needed support.   She is otherwise feeling okay and reports her energy levels are stable. She has occasional night sweats which was present before her cancer diagnosis. She denies any dietary changes or weight loss. She denies nausea, vomiting or abdominal pain. Her bowel habits are unchanged without any recurrent episodes of diarrhea or constipation. She denies easy bruising or bleeding. She denies fevers, chills ,shortness of breath, chest pain or cough. She has no other complaints.     REVIEW OF SYSTEMS:  Review of  Systems  Constitutional:  Negative for appetite change, fatigue and unexpected weight change.  HENT:   Negative for mouth sores.   Respiratory:  Negative for cough, shortness of breath and wheezing.   Cardiovascular:  Negative for chest pain and leg swelling.  Gastrointestinal:  Negative for abdominal pain, constipation, diarrhea and nausea.  Endocrine: Negative for hot flashes.  Genitourinary: Negative.    Musculoskeletal: Negative.   Skin: Negative.   Breast: Denies any new nodularity, masses, tenderness, nipple changes, or nipple discharge.    ONCOLOGY TREATMENT TEAM:  1. Surgeon:  Dr. Dr. Donnie Mesa at [facility] 2. Medical Oncologist: Dr. Nicholas Lose  3. Radiation Oncologist: Dr. Kyung Rudd   PAST MEDICAL/SURGICAL HISTORY:  Past Medical History:  Diagnosis Date   Asthma    as a child, no current problems as an adult, no inhaler   Ductal carcinoma in situ (DCIS) of left breast    Past Surgical History:  Procedure Laterality Date   BREAST LUMPECTOMY WITH RADIOACTIVE SEED AND SENTINEL LYMPH NODE BIOPSY Left 04/20/2021   Procedure: LEFT BREAST LUMPECTOMY WITH RADIOACTIVE SEED AND AXILLARY SENTINEL LYMPH NODE BIOPSY;  Surgeon: Donnie Mesa, MD;  Location: [facility];  Service: General;  Laterality: Left;   WISDOM TOOTH EXTRACTION       ALLERGIES:  No Known Allergies  CURRENT MEDICATIONS:  Outpatient Encounter Medications as of 09/20/2021  Medication Sig   Black Currant Seed Oil 500 MG CAPS Take by mouth.   letrozole (FEMARA) 2.5 MG tablet Take 1 tablet (2.5 mg total) by mouth daily.   Multiple Vitamins-Minerals (MULTIVITAMIN WITH MINERALS) tablet Take 1 tablet by mouth daily.   OVER THE COUNTER MEDICATION Take 1 tablet by mouth daily. vitamin b17   Turmeric (QC TUMERIC COMPLEX PO) Take by mouth.   HYDROcodone-acetaminophen (NORCO/VICODIN) 5-325 MG tablet Take 1 tablet by mouth every 6 (six) hours as needed for moderate pain. (Patient not taking: Reported on  09/20/2021)   No facility-administered encounter medications on file as of 09/20/2021.     ONCOLOGIC FAMILY HISTORY:  Family History  Problem Relation Age of Onset   Prostate cancer Father 2     GENETIC COUNSELING/TESTING:  [facility] CancerNext-Expanded Panel was Negative. Report date is 06/02/2021.   The CancerNext-Expanded gene panel offered by [facility] and includes sequencing, rearrangement, and RNA analysis for the following 77 genes: AIP, ALK, APC, ATM, AXIN2, BAP1, BARD1, BLM, BMPR1A, BRCA1, BRCA2, BRIP1, CDC73, CDH1, CDK4, CDKN1B, CDKN2A, CHEK2, CTNNA1, DICER1, FANCC, FH, FLCN, GALNT12, KIF1B, LZTR1, MAX, MEN1, MET, MLH1, MSH2, MSH3, MSH6, MUTYH, NBN, NF1, NF2, NTHL1, PALB2, PHOX2B, PMS2, POT1, PRKAR1A, PTCH1, PTEN, RAD51C, RAD51D, RB1, RECQL, RET, SDHA, SDHAF2, SDHB, SDHC, SDHD, SMAD4, SMARCA4, SMARCB1, SMARCE1, STK11, SUFU, TMEM127, TP53, TSC1, TSC2, VHL and XRCC2 (sequencing and deletion/duplication); EGFR, EGLN1, HOXB13, KIT, MITF, PDGFRA, POLD1, and POLE (sequencing only); EPCAM and GREM1 (deletion/duplication only).     SOCIAL HISTORY:  Social History   Socioeconomic History   Marital status: Married  Spouse name: Not on file   Number of children: Not on file   Years of education: Not on file   Highest education level: Not on file  Occupational History   Not on file  Tobacco Use   Smoking status: Never   Smokeless tobacco: Never   Tobacco comments:    Patient's husband is a smoker.  Vaping Use   Vaping Use: Never used  Substance and Sexual Activity   Alcohol use: Never   Drug use: Never   Sexual activity: Not Currently    Birth control/protection: Post-menopausal  Other Topics Concern   Not on file  Social History Narrative   Not on file   Social Determinants of Health   Financial Resource Strain: High Risk (04/22/2021)   Overall Financial Resource Strain (CARDIA)    Difficulty of Paying Living Expenses: Hard  Food Insecurity: Not on file   Transportation Needs: No Transportation Needs (04/22/2021)   PRAPARE - Hydrologist (Medical): No    Lack of Transportation (Non-Medical): No  Physical Activity: Not on file  Stress: Not on file  Social Connections: Not on file      PHYSICAL EXAMINATION:  Vital Signs:   Vitals:   09/20/21 1537  BP: 108/64  Pulse: 74  Resp: 20  Temp: 97.9 F (36.6 C)  SpO2: 98%   Filed Weights   09/20/21 1537  Weight: 154 lb (69.9 kg)   General: Well-nourished, well-appearing female in no acute distress.  She is unaccompanied in clinic today.   HEENT: Head is normocephalic.  Pupils equal and reactive to light. Conjunctivae clear without exudate.  Sclerae anicteric. Oral mucosa is pink, moist.  Oropharynx is pink without lesions or erythema.  Lymph: No cervical, supraclavicular, or infraclavicular lymphadenopathy noted on palpation.  Cardiovascular: Regular rate and rhythm.Marland Kitchen Respiratory: Clear to auscultation bilaterally. Chest expansion symmetric; breathing non-labored.  GI: Abdomen soft and round; non-tender, non-distended. Bowel sounds normoactive.  GU: Deferred.  Neuro: No focal deficits. Steady gait.  Psych: Mood and affect normal and appropriate for situation.  Extremities: No edema. MSK: No focal spinal tenderness to palpation.  Full range of motion in bilateral upper extremities Skin: Warm and dry.  LABORATORY DATA:  None for this visit.  DIAGNOSTIC IMAGING:  None for this visit.   ASSESSMENT AND PLAN:  Ms.. Bound is a pleasant 61 y.o. 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 She decided to forgo adjuvant radiation.  She presents to the Survivorship Clinic for our initial meeting and routine follow-up post-completion of treatment for breast cancer.   1. Stage 1A right/left breast cancer:  Ms. Gariepy is continuing  to recover from definitive treatment for breast cancer. She will follow-up with her medical oncologist, Dr. Lindi Adie in December 2023 with history and physical exam per surveillance protocol.  She will continue her anti-estrogen therapy with letrozole. Thus far, she is tolerating letrozole therapy well, with minimal side effects. She was instructed to make Dr. Lindi Adie or myself aware if she begins to experience any worsening side effects of the medication and I could see her back in clinic to help manage those side effects, as needed. Today, a comprehensive survivorship care plan and treatment summary was reviewed with the patient today detailing her breast cancer diagnosis, treatment course, potential late/long-term effects of treatment, appropriate follow-up care with recommendations for the future, and patient education resources.  A copy  of this summary, along with a letter will be sent to the patient's primary care provider via mail/fax/In Basket message after today's visit.    #. Bone health:  Given Ms. Spadaccini history of breast cancer and her current treatment regimen including anti-estrogen therapy with letrozole, she is at risk for bone demineralization.  I placed orders for bone density scan. In the meantime, she was encouraged to increase her consumption of foods rich in calcium, as well as increase her weight-bearing activities.  She was given education on specific activities to promote bone health.  #. Cancer screening:  Due to Ms. Calderone history and her age, she should receive screening for skin cancers, colon cancer, and gynecologic cancers.  The information and recommendations are listed on the patient's comprehensive care plan/treatment summary and were reviewed in detail with the patient.    #. Health maintenance and wellness promotion: Ms. Hook was encouraged to consume 5-7 servings of fruits and vegetables per day. We reviewed the "Nutrition Rainbow" handout, as well as the handout "Take Control  of Your Health and Reduce Your Cancer Risk" from the [employer].  She was also encouraged to engage in moderate to vigorous exercise for 30 minutes per day most days of the week. We discussed the [employer] fitness program, which is designed for cancer survivors to help them become more physically fit after cancer treatments.  She was instructed to limit her alcohol consumption and continue to abstain from tobacco use.   #. Support services/counseling: It is not uncommon for this period of the patient's cancer care trajectory to be one of many emotions and stressors.  We discussed an opportunity for her to participate in the next session of [facility] ("Finding Your New Normal") support group series designed for patients after they have completed treatment.   Ms. Mahler was encouraged to take advantage of our many other support services programs, support groups, and/or counseling in coping with her new life as a cancer survivor after completing anti-cancer treatment.  She was offered support today through active listening and expressive supportive counseling.  She was given information regarding our available services and encouraged to contact me with any questions or for help enrolling in any of our support group/programs.    Dispo:   -Return to cancer center in December 2023 for a follow up visit with Dr. Lindi Adie -Mammogram due in 02/2022 -Ordered bone density test -Follow up with surgery, Dr. Donnie Mesa around 10/2021 -She is welcome to return back to the Survivorship Clinic at any time; no additional follow-up needed at this time.  -Consider referral back to survivorship as a long-term survivor for continued surveillance  I have spent a total of 30 minutes minutes of face-to-face and non-face-to-face time, preparing to see the patient, obtaining and/or reviewing separately obtained history, performing a medically appropriate examination, counseling and educating the patient, documenting  clinical information in the electronic health record, and care coordination.    [place] [phone]

## 2021-09-21 ENCOUNTER — Telehealth: Payer: Self-pay | Admitting: Hematology and Oncology

## 2021-09-21 NOTE — Telephone Encounter (Signed)
Per 8/15 los called and left message for pt about appointment

## 2021-10-05 ENCOUNTER — Other Ambulatory Visit: Payer: Self-pay | Admitting: Physician Assistant

## 2021-10-05 DIAGNOSIS — C50912 Malignant neoplasm of unspecified site of left female breast: Secondary | ICD-10-CM

## 2021-11-01 ENCOUNTER — Ambulatory Visit
Admission: RE | Admit: 2021-11-01 | Discharge: 2021-11-01 | Disposition: A | Discharge status: Home / Self Care | Payer: PRIVATE HEALTH INSURANCE | Source: Ambulatory Visit | Attending: Physician Assistant | Admitting: Physician Assistant

## 2021-11-01 DIAGNOSIS — C50912 Malignant neoplasm of unspecified site of left female breast: Secondary | ICD-10-CM

## 2022-01-19 ENCOUNTER — Other Ambulatory Visit: Payer: Self-pay | Admitting: *Deleted

## 2022-01-19 DIAGNOSIS — C50912 Malignant neoplasm of unspecified site of left female breast: Secondary | ICD-10-CM

## 2022-01-19 NOTE — Progress Notes (Signed)
Patient Care Team: Pcp, No as PCP - General Nicholas Lose, MD as Consulting Physician (Hematology and Oncology) Donnie Mesa, MD as Consulting Physician (General Surgery)  DIAGNOSIS:  Encounter Diagnosis  Name Primary?   Malignant neoplasm of upper-outer quadrant of left breast in female, estrogen receptor positive ([place]) Yes    SUMMARY OF ONCOLOGIC HISTORY: Oncology History  Malignant neoplasm of upper-outer quadrant of left breast in female, estrogen receptor positive ([place])  04/20/2021 Surgery   Left lumpectomy: Grade 1 IDC 0.5 cm, margins negative, 0/3 lymph nodes negative ER 100%, PR 100%, Ki-67 2%, HER2 0 negative    05/12/2021 Genetic Testing   [facility] CancerNext-Expanded Panel was Negative. Report date is 06/02/2021.  The CancerNext-Expanded gene panel offered by [facility] and includes sequencing, rearrangement, and RNA analysis for the following 77 genes: AIP, ALK, APC, ATM, AXIN2, BAP1, BARD1, BLM, BMPR1A, BRCA1, BRCA2, BRIP1, CDC73, CDH1, CDK4, CDKN1B, CDKN2A, CHEK2, CTNNA1, DICER1, FANCC, FH, FLCN, GALNT12, KIF1B, LZTR1, MAX, MEN1, MET, MLH1, MSH2, MSH3, MSH6, MUTYH, NBN, NF1, NF2, NTHL1, PALB2, PHOX2B, PMS2, POT1, PRKAR1A, PTCH1, PTEN, RAD51C, RAD51D, RB1, RECQL, RET, SDHA, SDHAF2, SDHB, SDHC, SDHD, SMAD4, SMARCA4, SMARCB1, SMARCE1, STK11, SUFU, TMEM127, TP53, TSC1, TSC2, VHL and XRCC2 (sequencing and deletion/duplication); EGFR, EGLN1, HOXB13, KIT, MITF, PDGFRA, POLD1, and POLE (sequencing only); EPCAM and GREM1 (deletion/duplication only).    05/23/2021 - 05/2026 Anti-estrogen oral therapy   Letrozole, 2.5 mg daily     CHIEF COMPLIANT:  Left breast Malignant neoplasm   INTERVAL HISTORY: Gabriella Chaney is a 61 y.o. female is here because of recent diagnosis of left Breast Malignant neoplasm. Currently on Letrozole. She presents to the clinic for a follow-up. She reports she doesn't have any major side effects from taking the letrozole. She does have some mild stiffness in the  elbows. She says she sweats under left side arm but not under the right. She states when she put her deodorant on she does feel like a line under arm pit. But overall she is doing well.    ALLERGIES:  has No Known Allergies.  MEDICATIONS:  Current Outpatient Medications  Medication Sig Dispense Refill   Cholecalciferol (VITAMIN D3) 125 MCG (5000 UT) CAPS Take 1 capsule (5,000 Units total) by mouth daily. 30 capsule    Black Currant Seed Oil 500 MG CAPS Take by mouth.     letrozole (FEMARA) 2.5 MG tablet Take 1 tablet (2.5 mg total) by mouth daily. 90 tablet 3   Multiple Vitamins-Minerals (MULTIVITAMIN WITH MINERALS) tablet Take 1 tablet by mouth daily.     Turmeric (QC TUMERIC COMPLEX PO) Take by mouth.     No current facility-administered medications for this visit.    PHYSICAL EXAMINATION: ECOG PERFORMANCE STATUS: 1 - Symptomatic but completely ambulatory  Vitals:   01/20/22 1117  BP: 138/70  Pulse: 84  Resp: 18  Temp: 97.9 F (36.6 C)  SpO2: 100%   Filed Weights   01/20/22 1117  Weight: 155 lb 12.8 oz (70.7 kg)    BREAST: No palpable masses or nodules in either right or left breasts. No palpable axillary supraclavicular or infraclavicular adenopathy no breast tenderness or nipple discharge. (exam performed in the presence of a chaperone)  LABORATORY DATA:  I have reviewed the data as listed    Latest Ref Rng & Units 01/20/2022   11:05 AM  CMP  Glucose 70 - 99 mg/dL 77   BUN 6 - 20 mg/dL 13   Creatinine 0.44 - 1.00 mg/dL 0.94  Sodium 135 - 145 mmol/L 143   Potassium 3.5 - 5.1 mmol/L 4.7   Chloride 98 - 111 mmol/L 105   CO2 22 - 32 mmol/L 31   Calcium 8.9 - 10.3 mg/dL 10.2   Total Protein 6.5 - 8.1 g/dL 6.8   Total Bilirubin 0.3 - 1.2 mg/dL 0.5   Alkaline Phos 38 - 126 U/L 121   AST 15 - 41 U/L 30   ALT 0 - 44 U/L 33     Lab Results  Component Value Date   WBC 5.4 01/20/2022   HGB 13.5 01/20/2022   HCT 40.2 01/20/2022   MCV 93.5 01/20/2022   PLT 259  01/20/2022   NEUTROABS 2.7 01/20/2022    ASSESSMENT & PLAN:  Malignant neoplasm of upper-outer quadrant of left breast in female, estrogen receptor positive ([place]) 04/20/2021:Left lumpectomy: Grade 1 IDC 0.5 cm, margins negative, 0/3 lymph nodes negative ER 100%, PR 100%, Ki-67 2%, HER2 0 negative   Treatment plan: 1.  Adjuvant radiation (patient decided to forego radiation) 2. followed by adjuvant antiestrogen therapy with letrozole 2.5 mg daily started 05/23/2021   Letrozole toxicities: Tolerating it extremely well without any problems or concerns.  Denies any hot flashes.  She has mild myalgias and arthralgias but these are there even before starting the medication.  Breast cancer surveillance: Breast exam 01/20/2022: Benign Mammogram will need to be performed in January 2024 Bone density 11/02/2021: T-score -0.8: Normal  Her husband passed away in 08/11/2020 with ruptured aneurysm in the abdomen. Return to clinic in 1 year for follow-up    Orders Placed This Encounter  Procedures   MM DIAG BREAST TOMO BILATERAL    Standing Status:   Future    Standing Expiration Date:   01/21/2023    Order Specific Question:   Reason for Exam (SYMPTOM  OR DIAGNOSIS REQUIRED)    Answer:   annual mammogram    Order Specific Question:   Is the patient pregnant?    Answer:   No    Order Specific Question:   Preferred imaging location?    Answer:   [facility]   The patient has a good understanding of the overall plan. she agrees with it. she will call with any problems that may develop before the next visit here. Total time spent: 30 mins including face to face time and time spent for planning, charting and co-ordination of care   Harriette Ohara, MD 01/20/22    I Gardiner Coins am acting as a Education administrator for [employer]  I have reviewed the above documentation for accuracy and completeness, and I agree with the above.

## 2022-01-20 ENCOUNTER — Inpatient Hospital Stay: Payer: PRIVATE HEALTH INSURANCE | Attending: Hematology and Oncology

## 2022-01-20 ENCOUNTER — Other Ambulatory Visit: Payer: Self-pay

## 2022-01-20 ENCOUNTER — Inpatient Hospital Stay (HOSPITAL_BASED_OUTPATIENT_CLINIC_OR_DEPARTMENT_OTHER): Payer: PRIVATE HEALTH INSURANCE | Admitting: Hematology and Oncology

## 2022-01-20 VITALS — BP 138/70 | HR 84 | Temp 97.9°F | Resp 18 | Ht 65.0 in | Wt 155.8 lb

## 2022-01-20 DIAGNOSIS — Z170 Estrogen receptor positive status [ER+]: Secondary | ICD-10-CM | POA: Insufficient documentation

## 2022-01-20 DIAGNOSIS — Z79811 Long term (current) use of aromatase inhibitors: Secondary | ICD-10-CM | POA: Insufficient documentation

## 2022-01-20 DIAGNOSIS — C50412 Malignant neoplasm of upper-outer quadrant of left female breast: Secondary | ICD-10-CM | POA: Insufficient documentation

## 2022-01-20 DIAGNOSIS — C50912 Malignant neoplasm of unspecified site of left female breast: Secondary | ICD-10-CM

## 2022-01-20 LAB — CMP (CANCER CENTER ONLY)
ALT: 33 U/L (ref 0–44)
AST: 30 U/L (ref 15–41)
Albumin: 4.3 g/dL (ref 3.5–5.0)
Alkaline Phosphatase: 121 U/L (ref 38–126)
Anion gap: 7 (ref 5–15)
BUN: 13 mg/dL (ref 6–20)
CO2: 31 mmol/L (ref 22–32)
Calcium: 10.2 mg/dL (ref 8.9–10.3)
Chloride: 105 mmol/L (ref 98–111)
Creatinine: 0.94 mg/dL (ref 0.44–1.00)
GFR, Estimated: >60 mL/min (ref >60)
Glucose, Bld: 77 mg/dL (ref 70–99)
Potassium: 4.7 mmol/L (ref 3.5–5.1)
Sodium: 143 mmol/L (ref 135–145)
Total Bilirubin: 0.5 mg/dL (ref 0.3–1.2)
Total Protein: 6.8 g/dL (ref 6.5–8.1)

## 2022-01-20 LAB — CBC WITH DIFFERENTIAL (CANCER CENTER ONLY)
Abs Immature Granulocytes: 0.02 10*3/uL (ref 0.00–0.07)
Basophils Absolute: 0.1 10*3/uL (ref 0.0–0.1)
Basophils Relative: 1 %
Eosinophils Absolute: 0.1 10*3/uL (ref 0.0–0.5)
Eosinophils Relative: 2 %
HCT: 40.2 % (ref 36.0–46.0)
Hemoglobin: 13.5 g/dL (ref 12.0–15.0)
Immature Granulocytes: 0 %
Lymphocytes Relative: 39 %
Lymphs Abs: 2.1 10*3/uL (ref 0.7–4.0)
MCH: 31.4 pg (ref 26.0–34.0)
MCHC: 33.6 g/dL (ref 30.0–36.0)
MCV: 93.5 fL (ref 80.0–100.0)
Monocytes Absolute: 0.4 10*3/uL (ref 0.1–1.0)
Monocytes Relative: 8 %
Neutro Abs: 2.7 10*3/uL (ref 1.7–7.7)
Neutrophils Relative %: 50 %
Platelet Count: 259 10*3/uL (ref 150–400)
RBC: 4.3 MIL/uL (ref 3.87–5.11)
RDW: 12.8 % (ref 11.5–15.5)
WBC Count: 5.4 10*3/uL (ref 4.0–10.5)
nRBC: 0 % (ref 0.0–0.2)

## 2022-01-20 MED ORDER — VITAMIN D3 125 MCG (5000 UT) PO CAPS: 1.0000 | ORAL_CAPSULE | Freq: Every day | ORAL | Status: AC

## 2022-01-20 NOTE — Assessment & Plan Note (Signed)
04/20/2021:Left lumpectomy: Grade 1 IDC 0.5 cm, margins negative, 0/3 lymph nodes negative ER 100%, PR 100%, Ki-67 2%, HER2 0 negative   Treatment plan: 1.  Adjuvant radiation (patient decided to forego radiation) 2. followed by adjuvant antiestrogen therapy with letrozole 2.5 mg daily started 05/23/2021   Letrozole toxicities:  Breast cancer surveillance: Breast exam 01/20/2022: Benign Mammogram will need to be performed in January 2024 Bone density 11/02/2021: T-score -0.8: Normal  Return to clinic in 1 year for follow-up

## 2022-01-23 ENCOUNTER — Telehealth: Payer: Self-pay | Admitting: Hematology and Oncology

## 2022-01-23 NOTE — Telephone Encounter (Signed)
Scheduled appointment per 12/15 los. Patient is aware.

## 2022-02-16 ENCOUNTER — Other Ambulatory Visit: Payer: Self-pay | Admitting: *Deleted

## 2022-02-16 DIAGNOSIS — Z170 Estrogen receptor positive status [ER+]: Secondary | ICD-10-CM

## 2022-02-16 DIAGNOSIS — C50412 Malignant neoplasm of upper-outer quadrant of left female breast: Secondary | ICD-10-CM

## 2022-02-16 NOTE — Progress Notes (Signed)
Received message from [facility] team stating pt does not qualify for their program.  Pt states she will need a copy of the diagnostic mammogram emailed to her address [name] and she will have to submit it to her insurance company who in returns submits the order to [facility] to be completed. Or successfully sent to pt.

## 2022-02-17 ENCOUNTER — Telehealth: Payer: Self-pay | Admitting: *Deleted

## 2022-02-17 NOTE — Telephone Encounter (Signed)
Diagnostic mammogram orders were faxed to [facility] [phone] and receipt of confirmation. Pt notified of fax receipt

## 2022-03-03 ENCOUNTER — Encounter: Payer: Self-pay | Admitting: Hematology and Oncology

## 2022-05-18 ENCOUNTER — Other Ambulatory Visit: Payer: Self-pay | Admitting: Hematology and Oncology

## 2022-08-18 ENCOUNTER — Telehealth: Payer: Self-pay

## 2022-08-18 ENCOUNTER — Other Ambulatory Visit: Payer: Self-pay | Admitting: Hematology and Oncology

## 2022-08-18 NOTE — Telephone Encounter (Signed)
Returned Pt's call and LVM regarding left arm pain. Pt states she has had ongoing left arm pain since surgery in March 2023 but only with certain movements such as shaving left armpit. Pt denies swelling. LVM advising Pt to hold letrozole for 2 weeks and come in for NP appt at end of 2 weeks to discuss per MD. Jovita Gamma call back number and asked for call back to confirm appt.

## 2022-09-21 IMAGING — MG MM BREAST SURGICAL SPECIMEN
1 series · 1 of 1 positions shown · non-contrast
Comparison: Previous exam(s).

CLINICAL DATA: Radioactive seed localization was performed
yesterday in anticipation of today's lumpectomy.

EXAM:
SPECIMEN RADIOGRAPH OF THE LEFT BREAST

[L]
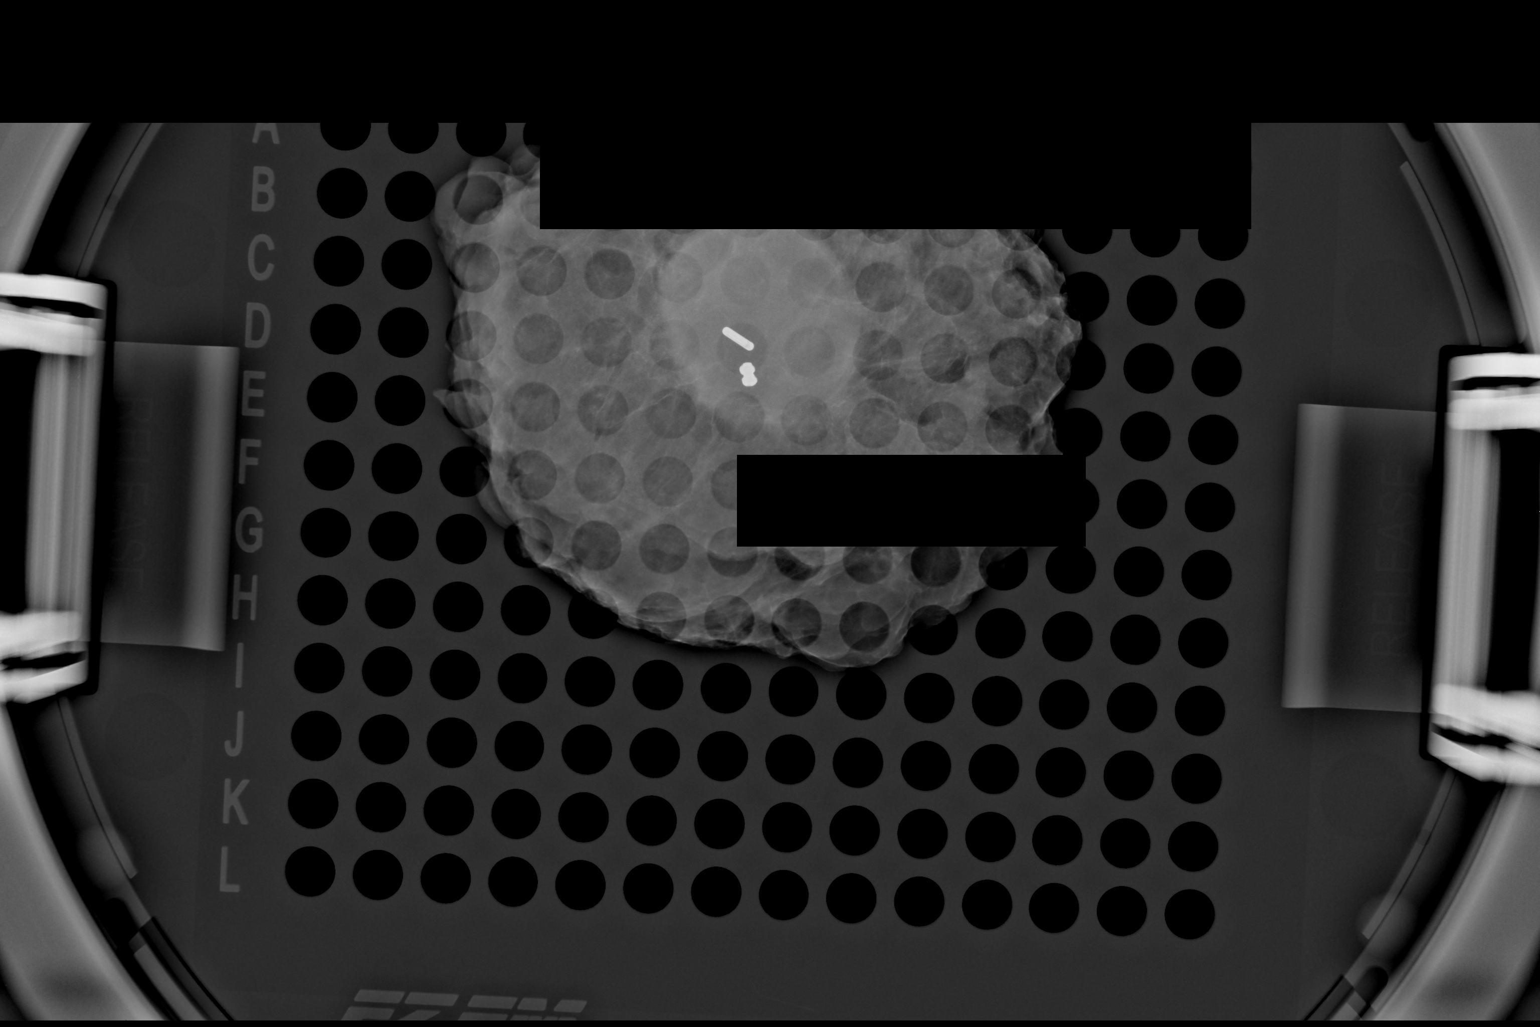

[1 of 1 positions shown; findings below may reference images not displayed]

FINDINGS: Status post excision of the LEFT breast. The radioactive seed and
the dumbbell-shaped tissue marker clip are present, completely
intact, and are marked for pathology. A mass is also present in the
specimen, likely a post biopsy hematoma. This was discussed by
telephone with the operating room nurse at the time of
interpretation on 04/20/2021 at [DATE] a.m.
IMPRESSION: Specimen radiograph of the LEFT breast.

## 2023-01-22 ENCOUNTER — Inpatient Hospital Stay: Payer: Commercial Managed Care - PPO | Attending: Hematology and Oncology | Admitting: Hematology and Oncology

## 2023-01-22 VITALS — BP 105/56 | HR 74 | Temp 97.9°F | Resp 18 | Ht 65.0 in | Wt 151.4 lb

## 2023-01-22 DIAGNOSIS — Z79811 Long term (current) use of aromatase inhibitors: Secondary | ICD-10-CM | POA: Diagnosis not present

## 2023-01-22 DIAGNOSIS — C50412 Malignant neoplasm of upper-outer quadrant of left female breast: Secondary | ICD-10-CM | POA: Diagnosis present

## 2023-01-22 DIAGNOSIS — Z170 Estrogen receptor positive status [ER+]: Secondary | ICD-10-CM | POA: Diagnosis not present

## 2023-01-22 MED ORDER — ANASTROZOLE 1 MG PO TABS: 1.0000 mg | ORAL_TABLET | Freq: Every day | ORAL | 3 refills | Status: DC

## 2023-01-22 NOTE — Progress Notes (Signed)
Patient Care Team: Pcp, No as PCP - General Serena Croissant, MD as Consulting Physician (Hematology and Oncology) Manus Rudd, MD as Consulting Physician (General Surgery)  DIAGNOSIS:  Encounter Diagnosis  Name Primary?   Malignant neoplasm of upper-outer quadrant of left breast in female, estrogen receptor positive (HCC) Yes    SUMMARY OF ONCOLOGIC HISTORY: Oncology History  Malignant neoplasm of upper-outer quadrant of left breast in female, estrogen receptor positive (HCC)  04/20/2021 Surgery   Left lumpectomy: Grade 1 IDC 0.5 cm, margins negative, 0/3 lymph nodes negative ER 100%, PR 100%, Ki-67 2%, HER2 0 negative    05/12/2021 Genetic Testing   [facility] CancerNext-Expanded Panel was Negative. Report date is 06/02/2021.  The CancerNext-Expanded gene panel offered by [facility] and includes sequencing, rearrangement, and RNA analysis for the following 77 genes: AIP, ALK, APC, ATM, AXIN2, BAP1, BARD1, BLM, BMPR1A, BRCA1, BRCA2, BRIP1, CDC73, CDH1, CDK4, CDKN1B, CDKN2A, CHEK2, CTNNA1, DICER1, FANCC, FH, FLCN, GALNT12, KIF1B, LZTR1, MAX, MEN1, MET, MLH1, MSH2, MSH3, MSH6, MUTYH, NBN, NF1, NF2, NTHL1, PALB2, PHOX2B, PMS2, POT1, PRKAR1A, PTCH1, PTEN, RAD51C, RAD51D, RB1, RECQL, RET, SDHA, SDHAF2, SDHB, SDHC, SDHD, SMAD4, SMARCA4, SMARCB1, SMARCE1, STK11, SUFU, TMEM127, TP53, TSC1, TSC2, VHL and XRCC2 (sequencing and deletion/duplication); EGFR, EGLN1, HOXB13, KIT, MITF, PDGFRA, POLD1, and POLE (sequencing only); EPCAM and GREM1 (deletion/duplication only).    05/23/2021 - 05/2026 Anti-estrogen oral therapy   Letrozole, 2.5 mg daily     CHIEF COMPLIANT: Follow-up to discuss antiestrogen therapy  HISTORY OF PRESENT ILLNESS:   History of Present Illness   The patient, a breast cancer survivor, presents with persistent left arm pain. She reports that the pain is a deep ache, particularly noticeable when moving the arm across the body. The pain has been ongoing and is severe enough to  interfere with daily activities. The patient has been taking anastrozole, a medication for breast cancer, and noticed that the pain seemed to improve when she stopped taking the medication for about a week. However, she has not been taking the medication consistently due to forgetfulness and concerns about the pain.  The patient also mentions having "brain fog," which she attributes to the Letrozole. She expresses concern about the potential for osteoporosis, a known side effect of Letrozole. The patient is also dealing with the loss of her husband and has taken a cheaper insurance plan for the upcoming year.  In addition to the arm pain, the patient mentions occasional discomfort in the breast area, but does not express significant concern about this. She has been taking multivitamins, turmeric, and vitamins D3 and K2, and has stopped taking black currant seed oil.         ALLERGIES:  has no known allergies.  MEDICATIONS:  Current Outpatient Medications  Medication Sig Dispense Refill   [START ON 02/07/2023] anastrozole (ARIMIDEX) 1 MG tablet Take 1 tablet (1 mg total) by mouth daily. 90 tablet 3   Cholecalciferol (VITAMIN D3) 125 MCG (5000 UT) CAPS Take 1 capsule (5,000 Units total) by mouth daily. 30 capsule    Multiple Vitamins-Minerals (MULTIVITAMIN WITH MINERALS) tablet Take 1 tablet by mouth daily.     Turmeric (QC TUMERIC COMPLEX PO) Take by mouth.     No current facility-administered medications for this visit.    PHYSICAL EXAMINATION: ECOG PERFORMANCE STATUS: 1 - Symptomatic but completely ambulatory  Vitals:   01/22/23 0945  BP: (!) 105/56  Pulse: 74  Resp: 18  Temp: 97.9 F (36.6 C)  SpO2: 100%   Filed Weights  01/22/23 0945  Weight: 151 lb 6.4 oz (68.7 kg)    Physical Exam   BREAST: No abnormalities palpated.      (exam performed in the presence of a chaperone)  LABORATORY DATA:  I have reviewed the data as listed    Latest Ref Rng & Units 01/20/2022    11:05 AM  CMP  Glucose 70 - 99 mg/dL 77   BUN 6 - 20 mg/dL 13   Creatinine 5.95 - 1.00 mg/dL 6.38   Sodium 756 - 433 mmol/L 143   Potassium 3.5 - 5.1 mmol/L 4.7   Chloride 98 - 111 mmol/L 105   CO2 22 - 32 mmol/L 31   Calcium 8.9 - 10.3 mg/dL 29.5   Total Protein 6.5 - 8.1 g/dL 6.8   Total Bilirubin 0.3 - 1.2 mg/dL 0.5   Alkaline Phos 38 - 126 U/L 121   AST 15 - 41 U/L 30   ALT 0 - 44 U/L 33     Lab Results  Component Value Date   WBC 5.4 01/20/2022   HGB 13.5 01/20/2022   HCT 40.2 01/20/2022   MCV 93.5 01/20/2022   PLT 259 01/20/2022   NEUTROABS 2.7 01/20/2022    ASSESSMENT & PLAN:  Malignant neoplasm of upper-outer quadrant of left breast in female, estrogen receptor positive (HCC) 04/20/2021:Left lumpectomy: Grade 1 IDC 0.5 cm, margins negative, 0/3 lymph nodes negative ER 100%, PR 100%, Ki-67 2%, HER2 0 negative    Treatment plan: 1.  Adjuvant radiation (patient decided to forego radiation) 2. followed by adjuvant antiestrogen therapy with letrozole 2.5 mg daily started 05/23/2021 discontinued 01/22/2023 because of left shoulder pain.  Switched to anastrozole 02/07/2023   Letrozole toxicities: Severe left shoulder pain which got better when she started taking it less often.   Breast cancer surveillance: Breast exam 01/22/2023: Benign Mammogram 02/23/2022: [facility]: Benign breast density category C Bone density 11/02/2021: T-score -0.8: Normal   Left shoulder pain: Improved with stopping letrozole.  Encouraged her to apply Voltaren gel and provided her with exercises.  Her husband passed away in Jun 25, 2022with ruptured aneurysm in the abdomen. Telephone visit in February to discuss tolerance to anastrozole therapy. Encouraged her to stay active and exercise regularly. Return to clinic in 1 year for follow-up     Orders Placed This Encounter  Procedures   MM Digital Screening    Standing Status:   Future    Expected Date:   02/27/2023    Expiration Date:   01/22/2024     Scheduling Instructions:     Patients insurance doesn't cover diagnostic mammograms    Reason for Exam (SYMPTOM  OR DIAGNOSIS REQUIRED):   Annual mammograms with H/O breast cancer    Preferred imaging location?:   External             [facility]    Release to patient:   Immediate   The patient has a good understanding of the overall plan. she agrees with it. she will call with any problems that may develop before the next visit here. Total time spent: 30 mins including face to face time and time spent for planning, charting and co-ordination of care   Tamsen Meek, MD 01/22/23

## 2023-01-22 NOTE — Assessment & Plan Note (Signed)
04/20/2021:Left lumpectomy: Grade 1 IDC 0.5 cm, margins negative, 0/3 lymph nodes negative ER 100%, PR 100%, Ki-67 2%, HER2 0 negative    Treatment plan: 1.  Adjuvant radiation (patient decided to forego radiation) 2. followed by adjuvant antiestrogen therapy with letrozole 2.5 mg daily started 05/23/2021   Letrozole toxicities: Tolerating it extremely well without any problems or concerns.  Denies any hot flashes.  She has mild myalgias and arthralgias but these are there even before starting the medication.   Breast cancer surveillance: Breast exam 01/22/2023: Benign Mammogram 02/23/2022: [facility]: Benign breast density category C Bone density 11/02/2021: T-score -0.8: Normal   Her husband passed away in 11-Jul-2022with ruptured aneurysm in the abdomen. Return to clinic in 1 year for follow-up

## 2023-03-12 ENCOUNTER — Inpatient Hospital Stay: Payer: PRIVATE HEALTH INSURANCE | Attending: Hematology and Oncology | Admitting: Hematology and Oncology

## 2023-03-12 DIAGNOSIS — Z170 Estrogen receptor positive status [ER+]: Secondary | ICD-10-CM | POA: Diagnosis not present

## 2023-03-12 DIAGNOSIS — C50412 Malignant neoplasm of upper-outer quadrant of left female breast: Secondary | ICD-10-CM

## 2023-03-12 NOTE — Assessment & Plan Note (Signed)
04/20/2021:Left lumpectomy: Grade 1 IDC 0.5 cm, margins negative, 0/3 lymph nodes negative ER 100%, PR 100%, Ki-67 2%, HER2 0 negative    Treatment plan: 1.  Adjuvant radiation (patient decided to forego radiation) 2. followed by adjuvant antiestrogen therapy with letrozole 2.5 mg daily started 05/23/2021 discontinued 01/22/2023 because of left shoulder pain.  Switched to anastrozole 02/07/2023   Anastrozole toxicities:     Her husband passed away in 07/10/2022with ruptured aneurysm in the abdomen.   Breast cancer surveillance: Breast exam 01/22/2023: Benign Mammogram 02/23/2022: [facility]: Benign breast density category C Bone density 11/02/2021: T-score -0.8: Normal  Return to clinic in 1 year for follow-up

## 2023-03-12 NOTE — Progress Notes (Signed)
HEMATOLOGY-ONCOLOGY TELEPHONE VISIT PROGRESS NOTE  I connected with our patient on 03/12/23 at  8:00 AM EST by telephone and verified that I am speaking with the correct person using two identifiers.  I discussed the limitations, risks, security and privacy concerns of performing an evaluation and management service by telephone and the availability of in person appointments.  I also discussed with the patient that there may be a patient responsible charge related to this service. The patient expressed understanding and agreed to proceed.   History of Present Illness: Follow-up on anastrozole therapy  History of Present Illness   The patient, with breast cancer, presents for follow-up regarding her medication switch from letrozole to anastrozole.  She was switched from letrozole to anastrozole to manage her breast cancer. Her symptoms, particularly related to her shoulder, seem to be improving with the new medication.  She mentions feeling 'a little bit of a cold' but otherwise doing okay. No other significant symptoms reported.  She received a notification from [facility] indicating it is time for her annual mammogram, which she had last done on January 18th of the previous year at [facility] in [place].        Oncology History  Malignant neoplasm of upper-outer quadrant of left breast in female, estrogen receptor positive (HCC)  04/20/2021 Surgery   Left lumpectomy: Grade 1 IDC 0.5 cm, margins negative, 0/3 lymph nodes negative ER 100%, PR 100%, Ki-67 2%, HER2 0 negative    05/12/2021 Genetic Testing   [facility] CancerNext-Expanded Panel was Negative. Report date is 06/02/2021.  The CancerNext-Expanded gene panel offered by [facility] and includes sequencing, rearrangement, and RNA analysis for the following 77 genes: AIP, ALK, APC, ATM, AXIN2, BAP1, BARD1, BLM, BMPR1A, BRCA1, BRCA2, BRIP1, CDC73, CDH1, CDK4, CDKN1B, CDKN2A, CHEK2, CTNNA1, DICER1, FANCC, FH, FLCN, GALNT12,  KIF1B, LZTR1, MAX, MEN1, MET, MLH1, MSH2, MSH3, MSH6, MUTYH, NBN, NF1, NF2, NTHL1, PALB2, PHOX2B, PMS2, POT1, PRKAR1A, PTCH1, PTEN, RAD51C, RAD51D, RB1, RECQL, RET, SDHA, SDHAF2, SDHB, SDHC, SDHD, SMAD4, SMARCA4, SMARCB1, SMARCE1, STK11, SUFU, TMEM127, TP53, TSC1, TSC2, VHL and XRCC2 (sequencing and deletion/duplication); EGFR, EGLN1, HOXB13, KIT, MITF, PDGFRA, POLD1, and POLE (sequencing only); EPCAM and GREM1 (deletion/duplication only).    05/23/2021 - 05/2026 Anti-estrogen oral therapy   Letrozole, 2.5 mg daily     REVIEW OF SYSTEMS:   Constitutional: Denies fevers, chills or abnormal weight loss All other systems were reviewed with the patient and are negative. Observations/Objective:     Assessment Plan:  Malignant neoplasm of upper-outer quadrant of left breast in female, estrogen receptor positive (HCC) 04/20/2021:Left lumpectomy: Grade 1 IDC 0.5 cm, margins negative, 0/3 lymph nodes negative ER 100%, PR 100%, Ki-67 2%, HER2 0 negative    Treatment plan: 1.  Adjuvant radiation (patient decided to forego radiation) 2. followed by adjuvant antiestrogen therapy with letrozole 2.5 mg daily started 05/23/2021 discontinued 01/22/2023 because of left shoulder pain.  Switched to anastrozole 02/07/2023   Anastrozole toxicities: Better    Her husband passed away in Jul 02, 2022with ruptured aneurysm in the abdomen.   Breast cancer surveillance: Breast exam 01/22/2023: Benign Mammogram 02/23/2022: At [facility] health Benign breast density category C, patient will call and schedule another mammogram Bone density 11/02/2021: T-score -0.8: Normal  Return to clinic in 1 year for follow-up   I discussed the assessment and treatment plan with the patient. The patient was provided an opportunity to ask questions and all were answered. The patient agreed with the plan and demonstrated an understanding  of the instructions. The patient was advised to call back or seek an in-person evaluation if the symptoms  worsen or if the condition fails to improve as anticipated.   I provided 20 minutes of non-face-to-face time during this encounter.  This includes time for charting and coordination of care   Tamsen Meek, MD

## 2023-09-16 ENCOUNTER — Other Ambulatory Visit: Payer: Self-pay

## 2023-09-16 ENCOUNTER — Emergency Department
Admission: EM | Admit: 2023-09-16 | Discharge: 2023-09-16 | Disposition: A | Discharge status: Skilled Nursing Facility | Attending: Emergency Medicine | Admitting: Emergency Medicine

## 2023-09-16 DIAGNOSIS — L03011 Cellulitis of right finger: Secondary | ICD-10-CM | POA: Diagnosis not present

## 2023-09-16 DIAGNOSIS — J45909 Unspecified asthma, uncomplicated: Secondary | ICD-10-CM | POA: Insufficient documentation

## 2023-09-16 DIAGNOSIS — M7989 Other specified soft tissue disorders: Secondary | ICD-10-CM | POA: Diagnosis present

## 2023-09-16 DIAGNOSIS — L03113 Cellulitis of right upper limb: Secondary | ICD-10-CM

## 2023-09-16 LAB — CBC WITH DIFFERENTIAL/PLATELET
Abs Immature Granulocytes: 0.01 K/uL (ref 0.00–0.07)
Basophils Absolute: 0 K/uL (ref 0.0–0.1)
Basophils Relative: 1 %
Eosinophils Absolute: 0.1 K/uL (ref 0.0–0.5)
Eosinophils Relative: 2 %
HCT: 37.3 % (ref 36.0–46.0)
Hemoglobin: 12.6 g/dL (ref 12.0–15.0)
Immature Granulocytes: 0 %
Lymphocytes Relative: 21 %
Lymphs Abs: 1 K/uL (ref 0.7–4.0)
MCH: 31.6 pg (ref 26.0–34.0)
MCHC: 33.8 g/dL (ref 30.0–36.0)
MCV: 93.5 fL (ref 80.0–100.0)
Monocytes Absolute: 0.4 K/uL (ref 0.1–1.0)
Monocytes Relative: 9 %
Neutro Abs: 3.2 K/uL (ref 1.7–7.7)
Neutrophils Relative %: 67 %
Platelets: 197 K/uL (ref 150–400)
RBC: 3.99 MIL/uL (ref 3.87–5.11)
RDW: 12 % (ref 11.5–15.5)
WBC: 4.7 K/uL (ref 4.0–10.5)
nRBC: 0 % (ref 0.0–0.2)

## 2023-09-16 LAB — BASIC METABOLIC PANEL WITH GFR
Anion gap: 9 (ref 5–15)
BUN: 21 mg/dL (ref 8–23)
CO2: 25 mmol/L (ref 22–32)
Calcium: 9.4 mg/dL (ref 8.9–10.3)
Chloride: 105 mmol/L (ref 98–111)
Creatinine, Ser: 0.79 mg/dL (ref 0.44–1.00)
GFR, Estimated: >60 mL/min (ref >60)
Glucose, Bld: 117 mg/dL — ABNORMAL HIGH (ref 70–99)
Potassium: 3.7 mmol/L (ref 3.5–5.1)
Sodium: 139 mmol/L (ref 135–145)

## 2023-09-16 MED ORDER — LIDOCAINE HCL (PF) 1 % IJ SOLN
5.0000 mL | Freq: Once | INTRAMUSCULAR | Status: AC
  Administered 2023-09-16: 5 mL via INTRADERMAL
  Filled 2023-09-16: qty 5

## 2023-09-16 MED ORDER — KETOROLAC TROMETHAMINE 15 MG/ML IJ SOLN
15.0000 mg | Freq: Once | INTRAMUSCULAR | Status: AC
  Administered 2023-09-16: 15 mg via INTRAVENOUS

## 2023-09-16 MED ORDER — AMOXICILLIN-POT CLAVULANATE 875-125 MG PO TABS: 1.0000 | ORAL_TABLET | Freq: Two times a day (BID) | ORAL | 0 refills | Status: DC

## 2023-09-16 MED ORDER — SULFAMETHOXAZOLE-TRIMETHOPRIM 800-160 MG PO TABS: 1.0000 | ORAL_TABLET | Freq: Two times a day (BID) | ORAL | 0 refills | Status: AC

## 2023-09-16 MED ORDER — AMOXICILLIN-POT CLAVULANATE 875-125 MG PO TABS
1.0000 | ORAL_TABLET | Freq: Once | ORAL | Status: DC
  Filled 2023-09-16: qty 1

## 2023-09-16 MED ORDER — KETOROLAC TROMETHAMINE 15 MG/ML IJ SOLN
15.0000 mg | Freq: Once | INTRAMUSCULAR | Status: DC
  Filled 2023-09-16: qty 1

## 2023-09-16 MED ORDER — CEPHALEXIN 500 MG PO CAPS: 500.0000 mg | ORAL_CAPSULE | Freq: Four times a day (QID) | ORAL | 0 refills | Status: AC

## 2023-09-16 MED ORDER — CEFAZOLIN SODIUM-DEXTROSE 1-4 GM/50ML-% IV SOLN
1.0000 g | Freq: Once | INTRAVENOUS | Status: AC
  Administered 2023-09-16: 1 g via INTRAVENOUS
  Filled 2023-09-16: qty 50

## 2023-09-16 NOTE — ED Provider Notes (Cosign Needed Addendum)
 [facility] Provider Note    Event Date/Time   First MD Initiated Contact with Patient 09/16/23 1848     (approximate)   History   Wound Check   HPI  Gabriella Chaney is a 63 y.o. female with PMH of ductal carcinoma in situ and asthma who presents for evaluation of a finger infection.  Patient noticed redness and swelling around the fingernail on her right index finger about 2 days ago.  She noticed streaking up her arm today which is what brought her into the ER.  Patient states that she has had an infection in this finger before.  She noticed that it began after she took off her gel nail polish.      Physical Exam   Triage Vital Signs: ED Triage Vitals  Encounter Vitals Group     BP 09/16/23 1655 (!) 144/85     Girls Systolic BP Percentile --      Girls Diastolic BP Percentile --      Boys Systolic BP Percentile --      Boys Diastolic BP Percentile --      Pulse Rate 09/16/23 1655 77     Resp 09/16/23 1655 18     Temp 09/16/23 1655 98.8 F (37.1 C)     Temp Source 09/16/23 1655 Oral     SpO2 09/16/23 1655 100 %     Weight 09/16/23 1654 151 lb (68.5 kg)     Height 09/16/23 1654 5' 4 (1.626 m)     Head Circumference --      Peak Flow --      Pain Score 09/16/23 1653 3     Pain Loc --      Pain Education --      Exclude from Growth Chart --     Most recent vital signs: Vitals:   09/16/23 1655 09/16/23 2255  BP: (!) 144/85 (!) 148/89  Pulse: 77   Resp: 18   Temp: 98.8 F (37.1 C)   SpO2: 100%    General: Awake, no distress.  CV:  Good peripheral perfusion.  Resp:  Normal effort.  Abd:  No distention.  Other:  Right index finger is erythematous, swollen and tender to palpation surrounding the nail with an area of fluctuance, there is lymphatic streaking up the finger and extending up to the upper right arm.   ED Results / Procedures / Treatments   Labs (all labs ordered are listed, but only abnormal results are displayed) Labs  Reviewed  BASIC METABOLIC PANEL WITH GFR - Abnormal; Notable for the following components:      Result Value   Glucose, Bld 117 (*)    All other components within normal limits  CBC WITH DIFFERENTIAL/PLATELET    PROCEDURES:  Critical Care performed: No  .Incision and Drainage  Date/Time: 09/16/2023 9:16 PM  Performed by: Cleaster Tinnie LABOR, PA-C Authorized by: Cleaster Tinnie LABOR, PA-C   Consent:    Consent obtained:  Verbal   Consent given by:  Patient   Risks, benefits, and alternatives were discussed: yes     Risks discussed:  Bleeding, incomplete drainage, pain and damage to other organs   Alternatives discussed:  No treatment Universal protocol:    Patient identity confirmed:  Verbally with patient Location:    Type:  Abscess   Location:  Upper extremity   Upper extremity location:  Finger   Finger location:  R index finger Pre-procedure details:    Skin preparation:  Povidone-iodine Anesthesia:    Anesthesia method:  Nerve block   Block location:  Base of right index finger   Block needle gauge:  25 G   Block anesthetic:  Lidocaine  1% w/o epi   Block technique:  Ring block   Block injection procedure:  Anatomic landmarks identified, introduced needle and incremental injection   Block outcome:  Anesthesia achieved Procedure type:    Complexity:  Simple Procedure details:    Incision types:  Stab incision   Incision depth:  Dermal   Drainage:  Bloody   Drainage amount:  Moderate   Wound treatment:  Wound left open   Packing materials:  None Post-procedure details:    Procedure completion:  Tolerated well, no immediate complications    MEDICATIONS ORDERED IN ED: Medications  lidocaine  (PF) (XYLOCAINE ) 1 % injection 5 mL (5 mLs Intradermal Given 09/16/23 1932)  ceFAZolin  (ANCEF ) IVPB 1 g/50 mL premix (0 g Intravenous Stopped 09/16/23 2257)  ketorolac  (TORADOL ) 15 MG/ML injection 15 mg (15 mg Intravenous Given 09/16/23 2151)     IMPRESSION / MDM /  ASSESSMENT AND PLAN / ED COURSE  I reviewed the triage vital signs and the nursing notes.                             EXTR 72-year-old female presents for evaluation of a finger infection.  Blood pressure is elevated otherwise vital signs are stable.  Patient NAD on exam.  Differential diagnosis includes, but is not limited to, paronychia, cellulitis, less likely felon and flexor tenosynovitis.  Patient's presentation is most consistent with acute, uncomplicated illness.  CBC and BMP are unremarkable.  No leukocytosis and kidney function is normal.  Very low suspicion for felon as most of the swelling appears to be on the dorsal side of the finger surrounding the nail rather than in the pulp of the finger.  Also do not suspect flexor tenosynovitis as swelling is localized to the tip of the finger and patient does not present with the [place] signs.  Suspect patient has paronychia, recommended I&D which patient was agreeable to.  See procedure note for further details.  Will give patient a dose of IV antibiotics as she has lymphatic streaking.    10:30 p.m.: Patient reassessed and states that the redness on her arm is becoming brighter but not spreading, I do agree, it was faint upon initial assessment and is now very clearly defined.  At this point recommended patient be admitted for IV antibiotics.  She declined, stating she really needed to get to work tomorrow.  We had a long conversation reviewing risks benefits.  Patient so would like to go home.  Will give her prescription for Keflex  and Bactrim .  Discussed very strict return precautions, signs and symptoms to watch for.  Patient voiced understanding, all questions were answered and she was stable at discharge.    FINAL CLINICAL IMPRESSION(S) / ED DIAGNOSES   Final diagnoses:  Paronychia of finger of right hand  Cellulitis of right upper extremity     Rx / DC Orders   ED Discharge Orders          Ordered     amoxicillin -clavulanate (AUGMENTIN ) 875-125 MG tablet  2 times daily,   Status:  Discontinued        09/16/23 2106    cephALEXin  (KEFLEX ) 500 MG capsule  4 times daily        09/16/23 2300  sulfamethoxazole -trimethoprim  (BACTRIM  DS) 800-160 MG tablet  2 times daily        09/16/23 2300             Note:  This document was prepared using Dragon voice recognition software and may include unintentional dictation errors.   Cleaster Tinnie LABOR, PA-C 09/16/23 2118    Cleaster Tinnie LABOR, PA-C 09/16/23 7695    Levander Slate, MD 09/17/23 2337

## 2023-09-16 NOTE — Discharge Instructions (Addendum)
 Please take the antibiotics as prescribed.  You can use triple antibiotic ointment over the wound on your finger.  Wash with soap and water daily and cover with a bandage.  You should begin to see some improvement after 48 hours of antibiotics.  Watch for signs of worsening infection including redness, warmth, swelling, pain and pus drainage.  Please also keep an eye out for fever, chills or bodyaches.  If you develop any of these please return to the ED, urgent care or your primary care provider.

## 2023-09-16 NOTE — ED Notes (Signed)
 PT'S finger is still noted to be red and swollen ; the red streaks on her arm also noted to have become more red/irritated and warm to the touch.Same is not noted to have spread at this time. Pt was informed that if it gets worse to come back to the hospital.

## 2023-09-16 NOTE — ED Notes (Signed)
 Pt presenting with swollen and red index finger of right hand. Red streaks can be seen going up pt's right arm to mid bicep. Pt stating some pain to finger but denies and other pain/SOB.

## 2023-09-16 NOTE — ED Triage Notes (Signed)
 Pt to ED for R index redness and swelling next to fingernail since 2 days ago and streaking up arm since 1 hour ago. Hx infection to same finger. Small area of swelling ot finger and 1 line of streaking up arm.

## 2023-09-17 ENCOUNTER — Telehealth: Payer: Self-pay

## 2023-09-17 NOTE — Telephone Encounter (Signed)
 Patient called and she says she has all 3 from the pharmacy and they told her to call to make sure, because most times it's one antibiotic to take. Advised per the ED provider notes to take cephalexine and sulfamethoxazole . Advised Augmentin  was ordered and discontinued same day, so the provider changed their minds based on the infection. She verbalized understanding. I asked if she has a PCP, she says she sees her OBGYN only. Advised it's good to have a PCP for any issues outside of GYN. She says she will just stick with the OBGYN for now because she doesn't have any health issues.    Copied from CRM #[id number]. Topic: Clinical - Prescription Issue >> Sep 17, 2023 12:13 PM Brittney F wrote: Reason for CRM:   Patient is calling in for clarification on receiving three antibiotics from the pharmacy to treat her cellulitis of the right arm and  Paronychia of finger of right hand. Patient would like to have confirmed whether or not she should be taking all three. Please call patient back.  Prescription In question:     amoxicillin -clavulanate (AUGMENTIN ) 875-125 MG tablet  2 times daily      cephALEXin  (KEFLEX ) 500 MG capsule  4 times daily            sulfamethoxazole -trimethoprim  (BACTRIM  DS) 800-160 MG tablet  2 times daily         Callback Number: [id number] >> Sep 17, 2023 12:40 PM Mercer PEDLAR wrote: Patient is calling back because she has not heard back.

## 2024-01-22 ENCOUNTER — Other Ambulatory Visit: Payer: Self-pay | Admitting: Hematology and Oncology

## 2024-01-22 ENCOUNTER — Inpatient Hospital Stay: Payer: PRIVATE HEALTH INSURANCE | Attending: Hematology and Oncology | Admitting: Hematology and Oncology

## 2024-01-22 VITALS — BP 138/68 | HR 72 | Temp 98.3°F | Resp 18 | Wt 148.8 lb

## 2024-01-22 DIAGNOSIS — M779 Enthesopathy, unspecified: Secondary | ICD-10-CM | POA: Insufficient documentation

## 2024-01-22 DIAGNOSIS — Z923 Personal history of irradiation: Secondary | ICD-10-CM | POA: Insufficient documentation

## 2024-01-22 DIAGNOSIS — C50412 Malignant neoplasm of upper-outer quadrant of left female breast: Secondary | ICD-10-CM | POA: Insufficient documentation

## 2024-01-22 DIAGNOSIS — Z780 Asymptomatic menopausal state: Secondary | ICD-10-CM | POA: Diagnosis not present

## 2024-01-22 DIAGNOSIS — Z170 Estrogen receptor positive status [ER+]: Secondary | ICD-10-CM | POA: Insufficient documentation

## 2024-01-22 DIAGNOSIS — Z79811 Long term (current) use of aromatase inhibitors: Secondary | ICD-10-CM | POA: Insufficient documentation

## 2024-01-22 NOTE — Assessment & Plan Note (Signed)
 04/20/2021:Left lumpectomy: Grade 1 IDC 0.5 cm, margins negative, 0/3 lymph nodes negative ER 100%, PR 100%, Ki-67 2%, HER2 0 negative    Treatment plan: 1.  Adjuvant radiation (patient decided to forego radiation) 2. followed by adjuvant antiestrogen therapy with letrozole  2.5 mg daily started 05/23/2021 discontinued 01/22/2023 because of left shoulder pain.  Switched to anastrozole  02/07/2023   Anastrozole  toxicities: Better    Her husband passed away in 06-25-22with ruptured aneurysm in the abdomen.    Breast cancer surveillance: Breast exam 01/22/2024: Benign Mammogram being done at [facility] health Benign breast density category C, Bone density 11/02/2021: T-score -0.8: Normal   Return to clinic in 1 year for follow-up
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
## 2024-02-05 ENCOUNTER — Ambulatory Visit (HOSPITAL_BASED_OUTPATIENT_CLINIC_OR_DEPARTMENT_OTHER)
Admission: RE | Admit: 2024-02-05 | Discharge: 2024-02-05 | Disposition: A | Discharge status: Home / Self Care | Source: Ambulatory Visit | Attending: Hematology and Oncology | Admitting: Hematology and Oncology

## 2024-02-05 DIAGNOSIS — Z780 Asymptomatic menopausal state: Secondary | ICD-10-CM | POA: Diagnosis not present

## 2025-01-21 ENCOUNTER — Inpatient Hospital Stay: Admitting: Hematology and Oncology
